# Patient Record
Sex: Female | Born: 1985 | Race: White | Hispanic: No | Marital: Single | Smoking: Former smoker
Health system: Southern US, Community
[De-identification: ages and names within clinical notes are randomized; demographics above are authoritative.]

## PROBLEM LIST (undated history)

## (undated) ENCOUNTER — Inpatient Hospital Stay (HOSPITAL_COMMUNITY): Payer: Self-pay

## (undated) DIAGNOSIS — Z3490 Encounter for supervision of normal pregnancy, unspecified, unspecified trimester: Secondary | ICD-10-CM

---

## 2008-11-20 ENCOUNTER — Emergency Department (HOSPITAL_BASED_OUTPATIENT_CLINIC_OR_DEPARTMENT_OTHER): Admission: EM | Admit: 2008-11-20 | Discharge: 2008-11-20 | Payer: Self-pay | Admitting: Emergency Medicine

## 2008-11-20 ENCOUNTER — Ambulatory Visit: Payer: Self-pay | Admitting: Interventional Radiology

## 2010-08-12 IMAGING — CT CT HEAD W/O CM
1 series · 16 of 30 positions shown, 20 images · non-contrast
Comparison: None

CLINICAL DATA: Altered level of consciousness, headache

CT HEAD WITHOUT CONTRAST
TECHNIQUE: Contiguous axial images were obtained from the base of
the skull through the vertex without contrast.

[Series 2: head 4.8 h37s · axial · 0.44mm/px · z∈[-145,-9]mm · 16 of 32 slices shown, 20 images]
[im 2/32  brain]
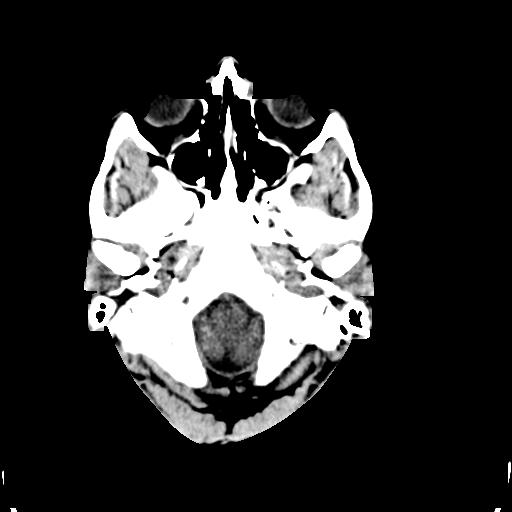
[im 2/32  bone]
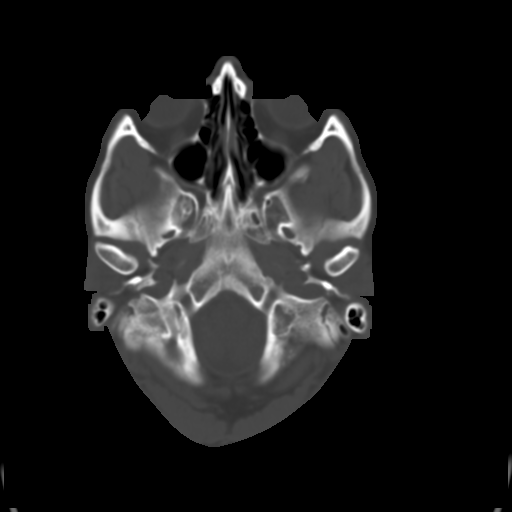
[im 4/32  brain]
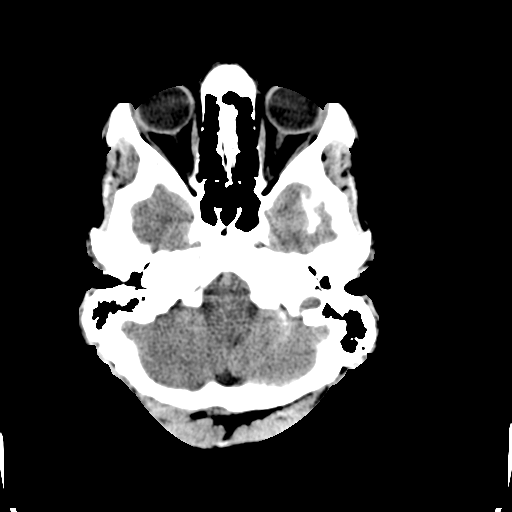
[im 6/32  brain]
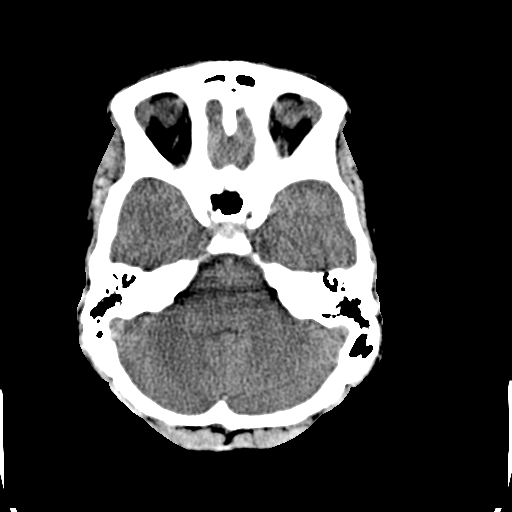
[im 8/32  brain]
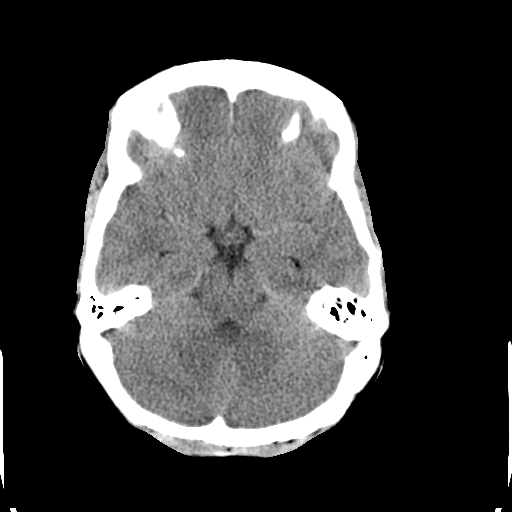
[im 9/32  brain]
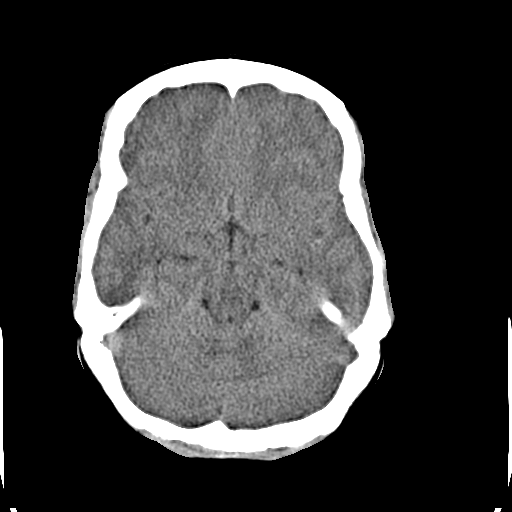
[im 9/32  bone]
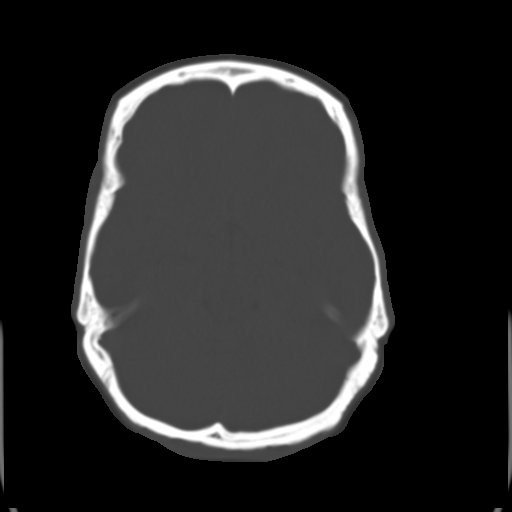
[im 11/32  brain]
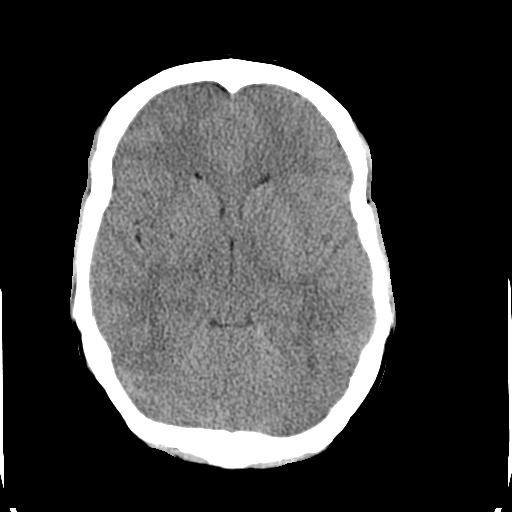
[im 13/32  brain]
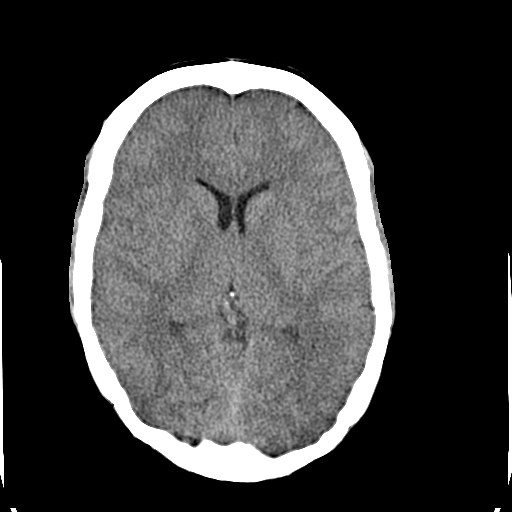
[im 15/32  brain]
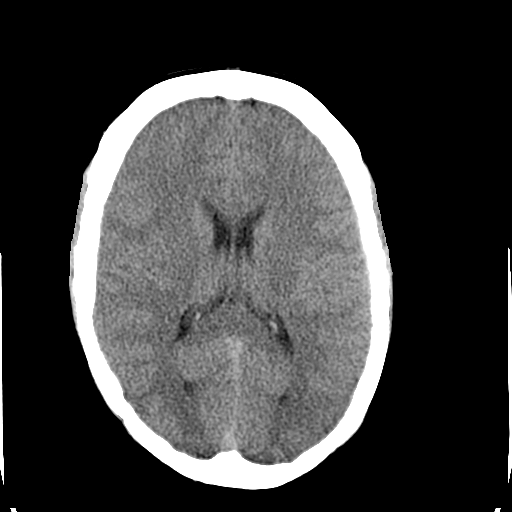
[im 17/32  brain]
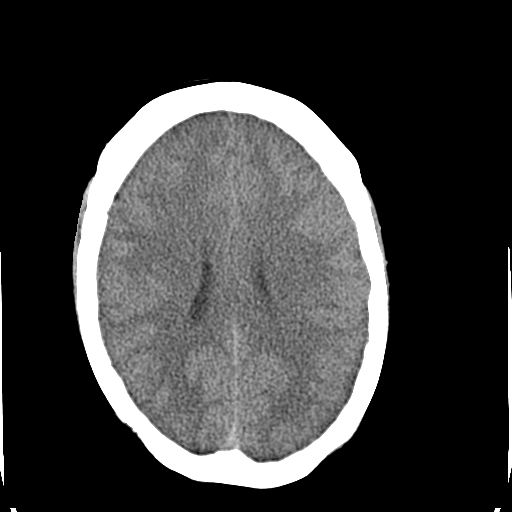
[im 17/32  bone]
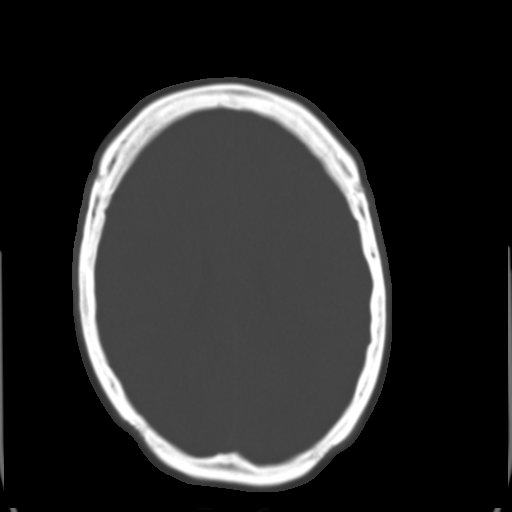
[im 19/32  brain]
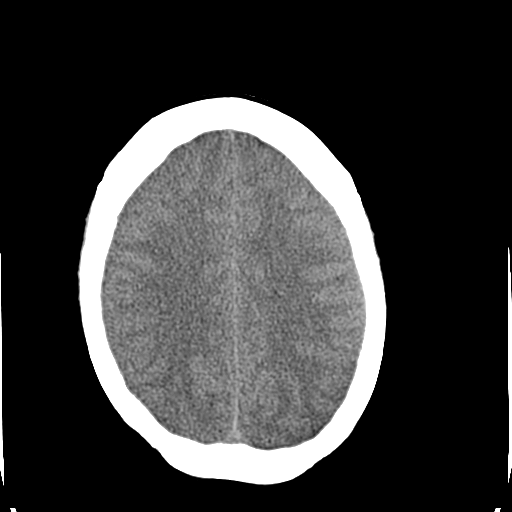
[im 21/32  brain]
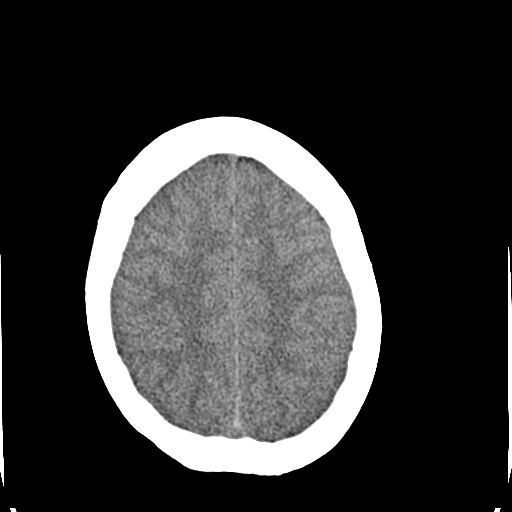
[im 23/32  brain]
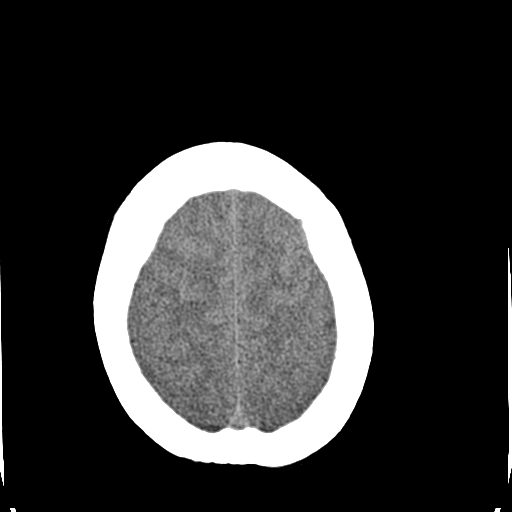
[im 24/32  brain]
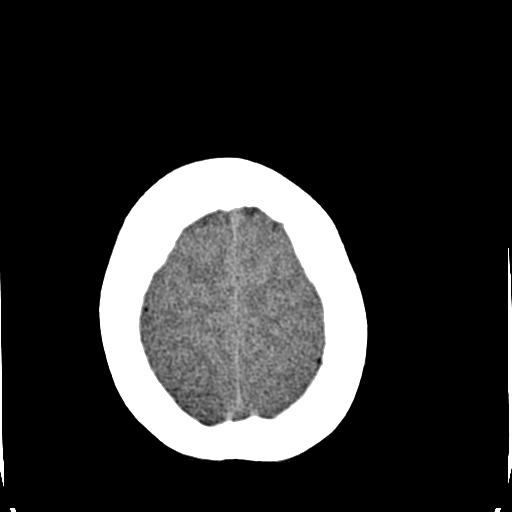
[im 24/32  bone]
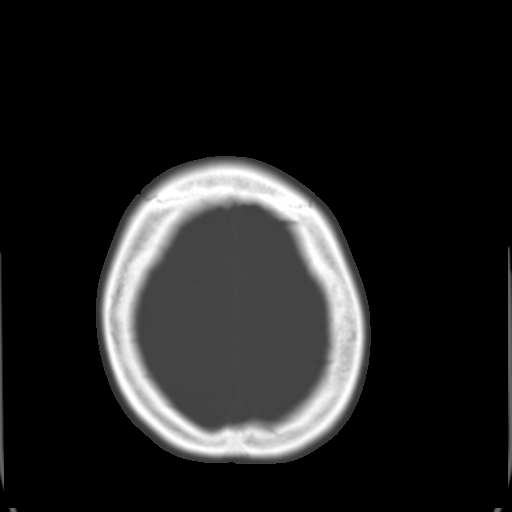
[im 26/32  brain]
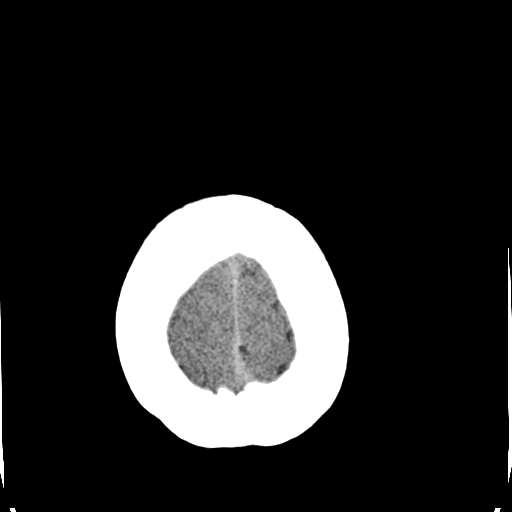
[im 28/32  brain]
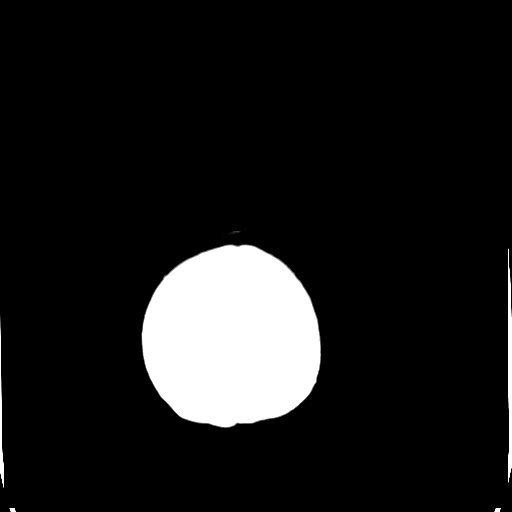
[im 30/32  brain]
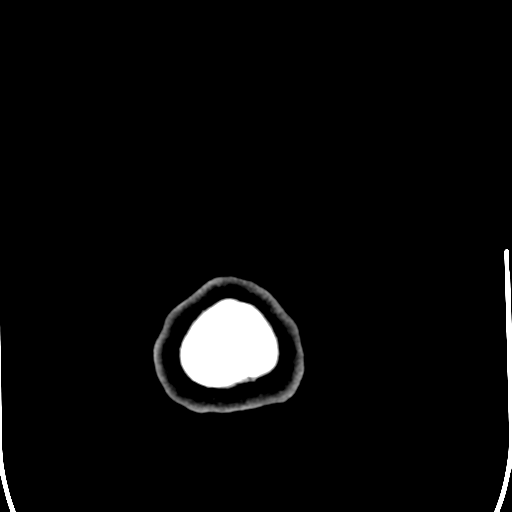

[16 of 30 positions shown; findings below may reference images not displayed]

FINDINGS: There is no evidence of acute intracranial hemorrhage,
brain edema, mass lesion, acute infarction,   mass effect, or
midline shift. Acute infarct may be inapparent on noncontrast CT.
No other intra-axial abnormalities are seen, and the ventricles and
sulci are within normal limits in size and symmetry.   No abnormal
extra-axial fluid collections or masses are identified.  No
significant calvarial abnormality.
IMPRESSION: Negative for bleed or other acute intracranial process.

## 2010-11-25 LAB — COMPREHENSIVE METABOLIC PANEL
ALT: 12 U/L (ref 0–35)
AST: 21 U/L (ref 0–37)
Albumin: 4.6 g/dL (ref 3.5–5.2)
Alkaline Phosphatase: 87 U/L (ref 39–117)
BUN: 13 mg/dL (ref 6–23)
Chloride: 104 mEq/L (ref 96–112)
Potassium: 4 mEq/L (ref 3.5–5.1)
Sodium: 143 mEq/L (ref 135–145)
Total Bilirubin: 0.5 mg/dL (ref 0.3–1.2)
Total Protein: 8.5 g/dL — ABNORMAL HIGH (ref 6.0–8.3)

## 2010-11-25 LAB — POCT TOXICOLOGY PANEL

## 2010-11-25 LAB — DIFFERENTIAL
Basophils Absolute: 0 10*3/uL (ref 0.0–0.1)
Basophils Relative: 0 % (ref 0–1)
Eosinophils Absolute: 0.1 10*3/uL (ref 0.0–0.7)
Eosinophils Relative: 1 % (ref 0–5)
Monocytes Absolute: 0.6 10*3/uL (ref 0.1–1.0)
Monocytes Relative: 6 % (ref 3–12)
Neutro Abs: 8.9 10*3/uL — ABNORMAL HIGH (ref 1.7–7.7)

## 2010-11-25 LAB — URINALYSIS, ROUTINE W REFLEX MICROSCOPIC
Bilirubin Urine: NEGATIVE
Glucose, UA: NEGATIVE mg/dL
Protein, ur: 100 mg/dL — AB
Urobilinogen, UA: 1 mg/dL (ref 0.0–1.0)

## 2010-11-25 LAB — CBC
HCT: 39.1 % (ref 36.0–46.0)
Platelets: 234 10*3/uL (ref 150–400)
RDW: 14.1 % (ref 11.5–15.5)
WBC: 10.4 10*3/uL (ref 4.0–10.5)

## 2010-11-25 LAB — POCT CARDIAC MARKERS
Myoglobin, poc: 117 ng/mL (ref 12–200)
Troponin i, poc: <0.05 ng/mL (ref 0.00–0.09)

## 2010-11-25 LAB — ETHANOL: Alcohol, Ethyl (B): <10 mg/dL (ref 0–10)

## 2010-11-25 LAB — URINE MICROSCOPIC-ADD ON

## 2011-11-08 ENCOUNTER — Emergency Department (HOSPITAL_BASED_OUTPATIENT_CLINIC_OR_DEPARTMENT_OTHER)
Admission: EM | Admit: 2011-11-08 | Discharge: 2011-11-08 | Disposition: A | Discharge status: Home / Self Care | Payer: Self-pay | Attending: Emergency Medicine | Admitting: Emergency Medicine

## 2011-11-08 ENCOUNTER — Encounter (HOSPITAL_BASED_OUTPATIENT_CLINIC_OR_DEPARTMENT_OTHER): Payer: Self-pay | Admitting: Family Medicine

## 2011-11-08 DIAGNOSIS — J020 Streptococcal pharyngitis: Secondary | ICD-10-CM | POA: Insufficient documentation

## 2011-11-08 DIAGNOSIS — R51 Headache: Secondary | ICD-10-CM | POA: Insufficient documentation

## 2011-11-08 LAB — RAPID STREP SCREEN (MED CTR MEBANE ONLY): Streptococcus, Group A Screen (Direct): POSITIVE — AB

## 2011-11-08 MED ORDER — DEXAMETHASONE SODIUM PHOSPHATE 10 MG/ML IJ SOLN
10.0000 mg | Freq: Once | INTRAMUSCULAR | Status: AC
  Administered 2011-11-08: 10 mg via INTRAMUSCULAR
  Filled 2011-11-08: qty 1

## 2011-11-08 MED ORDER — PENICILLIN G BENZATHINE 1200000 UNIT/2ML IM SUSP
1.2000 10*6.[IU] | Freq: Once | INTRAMUSCULAR | Status: AC
  Administered 2011-11-08: 1.2 10*6.[IU] via INTRAMUSCULAR
  Filled 2011-11-08: qty 2

## 2011-11-08 MED ORDER — KETOROLAC TROMETHAMINE 60 MG/2ML IM SOLN
60.0000 mg | Freq: Once | INTRAMUSCULAR | Status: AC
  Administered 2011-11-08: 60 mg via INTRAMUSCULAR
  Filled 2011-11-08: qty 2

## 2011-11-08 MED ORDER — BUTALBITAL-APAP-CAFFEINE 50-325-40 MG PO TABS: 1.0000 | ORAL_TABLET | Freq: Four times a day (QID) | ORAL | Status: DC | PRN

## 2011-11-08 NOTE — ED Notes (Signed)
Patient reports headaches that have been occuring for the past 5-6 weeks.

## 2011-11-08 NOTE — ED Provider Notes (Signed)
History     CSN: [id number]  Arrival date & time 11/08/11  0865   First MD Initiated Contact with Patient 11/08/11 1027      Chief Complaint  Patient presents with  . Headache    (Consider location/radiation/quality/duration/timing/severity/associated sxs/prior treatment) HPI Comments: Patient presents with 2 complaints.  She complains of a headache that's been going on for approximately 5-6 weeks and has been constant in nature.  She describes it as a generalized dull and throbbing headache.  She has tried aspirin and ibuprofen and Tylenol for it without significant relief.  She has no associated fevers, nausea, vomiting or neck stiffness.  She denies prior history of headaches or migraines.  She has not had her primary care physician so she has not followed up with anyone for this.  She secondarily presents for sore throat which is been going on for 2 days and pain with swallowing.  No shortness of breath.  No other cough or cold symptoms.  Patient is a 26 y.o. female presenting with headaches. The history is provided by the patient. No language interpreter was used.  Headache  This is a chronic problem. The current episode started more than 1 week ago. The problem occurs constantly. The problem has not changed since onset.The headache is associated with nothing. The quality of the pain is described as dull and throbbing. The pain is moderate. The pain does not radiate. Pertinent negatives include no anorexia, no fever, no malaise/fatigue, no chest pressure, no near-syncope, no orthopnea, no palpitations, no syncope, no shortness of breath, no nausea and no vomiting.    History reviewed. No pertinent past medical history.  Past Surgical History  Procedure Date  . Cesarean section     History reviewed. No pertinent family history.  History  Substance Use Topics  . Smoking status: Current Some Day Smoker  . Smokeless tobacco: Not on file  . Alcohol Use: Yes    OB History    Grav Para Term Preterm Abortions TAB SAB Ect Mult Living                  Review of Systems  Constitutional: Negative.  Negative for fever, chills and malaise/fatigue.  HENT: Positive for sore throat.   Eyes: Negative.  Negative for discharge and redness.  Respiratory: Negative.  Negative for cough and shortness of breath.   Cardiovascular: Negative.  Negative for chest pain, palpitations, orthopnea, syncope and near-syncope.  Gastrointestinal: Negative.  Negative for nausea, vomiting, abdominal pain, diarrhea and anorexia.  Genitourinary: Negative.  Negative for dysuria and vaginal discharge.  Musculoskeletal: Negative.  Negative for back pain.  Skin: Negative.  Negative for color change and rash.  Neurological: Positive for headaches. Negative for syncope.  Hematological: Negative.  Negative for adenopathy.  Psychiatric/Behavioral: Negative.  Negative for confusion.  All other systems reviewed and are negative.    Allergies  Review of patient's allergies indicates no known allergies.  Home Medications   Current Outpatient Rx  Name Route Sig Dispense Refill  . ASPIRIN BUF(ALHYD-MGHYD-CACAR) PO Oral Take by mouth.    Marland Kitchen ADVIL PO Oral Take by mouth.      BP 121/86  Pulse 110  Temp(Src) 97.8 F (36.6 C) (Oral)  Resp 20  Ht 5\' 7"  (1.702 m)  Wt 140 lb (63.504 kg)  BMI 21.93 kg/m2  SpO2 100%  LMP 10/11/2011  Physical Exam  Nursing note and vitals reviewed. Constitutional: She is oriented to person, place, and time. She appears well-developed and well-nourished.  Non-toxic appearance. She does not have a sickly appearance.  HENT:  Head: Normocephalic and atraumatic.  Mouth/Throat: No oropharyngeal exudate.       Moderate erythema to the posterior oropharynx without any tonsillar swelling or exudates.  Uvula is midline.  Eyes: Conjunctivae, EOM and lids are normal. Pupils are equal, round, and reactive to light. No scleral icterus.  Neck: Trachea normal and normal range of  motion. Neck supple. No thyromegaly present.  Cardiovascular: Normal rate, regular rhythm and normal heart sounds.   Pulmonary/Chest: Effort normal and breath sounds normal. No respiratory distress. She has no wheezes. She has no rales.  Abdominal: Soft. Normal appearance. There is no tenderness. There is no rebound, no guarding and no CVA tenderness.  Musculoskeletal: Normal range of motion.  Lymphadenopathy:    She has no cervical adenopathy.  Neurological: She is alert and oriented to person, place, and time. She has normal strength.  Skin: Skin is warm, dry and intact. No rash noted.  Psychiatric: She has a normal mood and affect. Her behavior is normal. Judgment and thought content normal.    ED Course  Procedures (including critical care time)  Labs Reviewed  RAPID STREP SCREEN - Abnormal; Notable for the following:    Streptococcus, Group A Screen (Direct) POSITIVE (*)    All other components within normal limits   No results found.   No diagnosis found.    MDM  Patient's her throat is likely due to strep pharyngitis.  We'll treat her with a Bicillin injection here.  Patient's headaches may potentially be migraine but do not appear to be an acute cause.  She has no neurologic deficits.  No fevers to suggest meningitis.  This was not a sudden onset headache to suggest subarachnoid hemorrhage.  I've recommended that the patient followup with a primary care physician or a neurologist.  I will give her information for the neurologists locally as well.        Nat Christen, MD 11/08/11 1220

## 2011-11-08 NOTE — Discharge Instructions (Signed)
Headache, General, Unknown Cause The specific cause of your headache may not have been found today. There are many causes and types of headache. A few common ones are:  Tension headache.   Migraine.   Infections (examples: dental and sinus infections).   Bone and/or joint problems in the neck or jaw.   Depression.   Eye problems.  These headaches are not life threatening.  Headaches can sometimes be diagnosed by a patient history and a physical exam. Sometimes, lab and imaging studies (such as x-ray and/or CT scan) are used to rule out more serious problems. In some cases, a spinal tap (lumbar puncture) may be requested. There are many times when your exam and tests may be normal on the first visit even when there is a serious problem causing your headaches. Because of that, it is very important to follow up with your doctor or local clinic for further evaluation. FINDING OUT THE RESULTS OF TESTS  If a radiology test was performed, a radiologist will review your results.   You will be contacted by the emergency department or your physician if any test results require a change in your treatment plan.   Not all test results may be available during your visit. If your test results are not back during the visit, make an appointment with your caregiver to find out the results. Do not assume everything is normal if you have not heard from your caregiver or the medical facility. It is important for you to follow up on all of your test results.  HOME CARE INSTRUCTIONS   Keep follow-up appointments with your caregiver, or any specialist referral.   Only take over-the-counter or prescription medicines for pain, discomfort, or fever as directed by your caregiver.   Biofeedback, massage, or other relaxation techniques may be helpful.   Ice packs or heat applied to the head and neck can be used. Do this three to four times per day, or as needed.   Call your doctor if you have any questions or  concerns.   If you smoke, you should quit.  SEEK MEDICAL CARE IF:   You develop problems with medications prescribed.   You do not respond to or obtain relief from medications.   You have a change from the usual headache.   You develop nausea or vomiting.  SEEK IMMEDIATE MEDICAL CARE IF:   If your headache becomes severe.   You have an unexplained oral temperature above 102 F (38.9 C), or as your caregiver suggests.   You have a stiff neck.   You have loss of vision.   You have muscular weakness.   You have loss of muscular control.   You develop severe symptoms different from your first symptoms.   You start losing your balance or have trouble walking.   You feel faint or pass out.  MAKE SURE YOU:   Understand these instructions.   Will watch your condition.   Will get help right away if you are not doing well or get worse.  Document Released: 08/02/2005 Document Revised: 07/22/2011 Document Reviewed: 03/21/2008 [facility] Patient Information 2012 [place], [place].  Strep Throat Strep throat is an infection of the throat caused by a bacteria named Streptococcus pyogenes. Your caregiver may call the infection streptococcal "tonsillitis" or "pharyngitis" depending on whether there are signs of inflammation in the tonsils or back of the throat. Strep throat is most common in children from 56 to 26 years old during the cold months of the year, but  it can occur in people of any age during any season. This infection is spread from person to person (contagious) through coughing, sneezing, or other close contact. SYMPTOMS   Fever or chills.   Painful, swollen, red tonsils or throat.   Pain or difficulty when swallowing.   White or yellow spots on the tonsils or throat.   Swollen, tender lymph nodes or "glands" of the neck or under the jaw.   Red rash all over the body (rare).  DIAGNOSIS  Many different infections can cause the same symptoms. A test must be done to  confirm the diagnosis so the right treatment can be given. A "rapid strep test" can help your caregiver make the diagnosis in a few minutes. If this test is not available, a light swab of the infected area can be used for a throat culture test. If a throat culture test is done, results are usually available in a day or two. TREATMENT  Strep throat is treated with antibiotic medicine. HOME CARE INSTRUCTIONS   Gargle with 1 tsp of salt in 1 cup of warm water, 3 to 4 times per day or as needed for comfort.   Family members who also have a sore throat or fever should be tested for strep throat and treated with antibiotics if they have the strep infection.   Make sure everyone in your household washes their hands well.   Do not share food, drinking cups, or personal items that could cause the infection to spread to others.   You may need to eat a soft food diet until your sore throat gets better.   Drink enough water and fluids to keep your urine clear or pale yellow. This will help prevent dehydration.   Get plenty of rest.   Stay home from school, daycare, or work until you have been on antibiotics for 24 hours.   Only take over-the-counter or prescription medicines for pain, discomfort, or fever as directed by your caregiver.   If antibiotics are prescribed, take them as directed. Finish them even if you start to feel better.  SEEK MEDICAL CARE IF:   The glands in your neck continue to enlarge.   You develop a rash, cough, or earache.   You cough up green, yellow-brown, or bloody sputum.   You have pain or discomfort not controlled by medicines.   Your problems seem to be getting worse rather than better.  SEEK IMMEDIATE MEDICAL CARE IF:   You develop any new symptoms such as vomiting, severe headache, stiff or painful neck, chest pain, shortness of breath, or trouble swallowing.   You develop severe throat pain, drooling, or changes in your voice.   You develop swelling of the  neck, or the skin on the neck becomes red and tender.   You have a fever.   You develop signs of dehydration, such as fatigue, dry mouth, and decreased urination.   You become increasingly sleepy, or you cannot wake up completely.  Document Released: 07/30/2000 Document Revised: 07/22/2011 Document Reviewed: 10/01/2010 [facility] Patient Information 2012 [place], [place].

## 2011-11-08 NOTE — ED Notes (Signed)
Pt c/o headache 5-6 weeks with "some numbness to my right face" and ringing in ears. Pt also reports nausea intermittently.

## 2012-06-20 ENCOUNTER — Emergency Department (HOSPITAL_BASED_OUTPATIENT_CLINIC_OR_DEPARTMENT_OTHER)
Admission: EM | Admit: 2012-06-20 | Discharge: 2012-06-20 | Disposition: A | Discharge status: Home / Self Care | Payer: Self-pay | Attending: Emergency Medicine | Admitting: Emergency Medicine

## 2012-06-20 ENCOUNTER — Encounter (HOSPITAL_BASED_OUTPATIENT_CLINIC_OR_DEPARTMENT_OTHER): Payer: Self-pay | Admitting: *Deleted

## 2012-06-20 DIAGNOSIS — L299 Pruritus, unspecified: Secondary | ICD-10-CM | POA: Insufficient documentation

## 2012-06-20 DIAGNOSIS — L509 Urticaria, unspecified: Secondary | ICD-10-CM | POA: Insufficient documentation

## 2012-06-20 DIAGNOSIS — F17200 Nicotine dependence, unspecified, uncomplicated: Secondary | ICD-10-CM | POA: Insufficient documentation

## 2012-06-20 MED ORDER — CETIRIZINE HCL 10 MG PO CHEW: 10.0000 mg | CHEWABLE_TABLET | Freq: Every day | ORAL | Status: DC

## 2012-06-20 MED ORDER — DEXAMETHASONE SODIUM PHOSPHATE 4 MG/ML IJ SOLN
10.0000 mg | Freq: Once | INTRAMUSCULAR | Status: AC
  Administered 2012-06-20: 10 mg via INTRAMUSCULAR
  Filled 2012-06-20: qty 1
  Filled 2012-06-20: qty 3

## 2012-06-20 MED ORDER — FAMOTIDINE 20 MG PO TABS: 20.0000 mg | ORAL_TABLET | Freq: Two times a day (BID) | ORAL | Status: DC

## 2012-06-20 NOTE — ED Provider Notes (Signed)
History     CSN: [id number]  Arrival date & time 06/20/12  4782   First MD Initiated Contact with Patient 06/20/12 [phone]      Chief Complaint  Patient presents with  . Rash    HPI 2 week history of rash.  Very pruritic.  Given cream.  No better, maybe worse.  No fever primarily trunk and arms.  Some on top of legs.    History reviewed. No pertinent past medical history.  Past Surgical History  Procedure Date  . Cesarean section     History reviewed. No pertinent family history.  History  Substance Use Topics  . Smoking status: Current Some Day Smoker  . Smokeless tobacco: Not on file  . Alcohol Use: Yes    OB History    Grav Para Term Preterm Abortions TAB SAB Ect Mult Living                  Review of Systems All other systems reviewed and are negative Allergies  Review of patient's allergies indicates no known allergies.  Home Medications   Current Outpatient Rx  Name  Route  Sig  Dispense  Refill  . ASPIRIN BUF(ALHYD-MGHYD-CACAR) PO   Oral   Take by mouth.         Marland Kitchen BUTALBITAL-APAP-CAFFEINE 50-325-40 MG PO TABS   Oral   Take 1-2 tablets by mouth every 6 (six) hours as needed for headache.   20 tablet   0   . CETIRIZINE HCL 10 MG PO CHEW   Oral   Chew 1 tablet (10 mg total) by mouth daily.   30 tablet   0   . FAMOTIDINE 20 MG PO TABS   Oral   Take 1 tablet (20 mg total) by mouth 2 (two) times daily.   30 tablet   0   . ADVIL PO   Oral   Take by mouth.           BP 114/66  Pulse 82  Temp 98.7 F (37.1 C) (Oral)  Resp 20  Ht 5\' 7"  (1.702 m)  Wt 140 lb (63.504 kg)  BMI 21.93 kg/m2  SpO2 100%  LMP 06/06/2012  Physical Exam  Nursing note and vitals reviewed. Constitutional: She is oriented to person, place, and time. She appears well-developed and well-nourished. No distress.  HENT:  Head: Normocephalic and atraumatic.  Eyes: Pupils are equal, round, and reactive to light.  Neck: Normal range of motion.  Cardiovascular:  Normal rate and intact distal pulses.   Pulmonary/Chest: No respiratory distress.  Abdominal: Normal appearance. She exhibits no distension.  Musculoskeletal: Normal range of motion.  Neurological: She is alert and oriented to person, place, and time. No cranial nerve deficit.  Skin: Skin is warm and dry. Rash (Urticarial rash noted primarily on trunk and arms.  Some lesions on upper legs.) noted.  Psychiatric: She has a normal mood and affect. Her behavior is normal.    ED Course  Procedures (including critical care time)  Labs Reviewed - No data to display No results found.   1. Urticaria       MDM         Nelia Shi, MD 06/20/12 1007

## 2012-06-20 NOTE — ED Notes (Signed)
Pt has been breaking out in hives off and on for 2 weeks no new products reported

## 2012-06-29 ENCOUNTER — Emergency Department (HOSPITAL_BASED_OUTPATIENT_CLINIC_OR_DEPARTMENT_OTHER)
Admission: EM | Admit: 2012-06-29 | Discharge: 2012-06-29 | Disposition: A | Discharge status: Home / Self Care | Payer: Self-pay | Attending: Emergency Medicine | Admitting: Emergency Medicine

## 2012-06-29 ENCOUNTER — Encounter (HOSPITAL_BASED_OUTPATIENT_CLINIC_OR_DEPARTMENT_OTHER): Payer: Self-pay | Admitting: *Deleted

## 2012-06-29 DIAGNOSIS — F17200 Nicotine dependence, unspecified, uncomplicated: Secondary | ICD-10-CM | POA: Insufficient documentation

## 2012-06-29 DIAGNOSIS — R21 Rash and other nonspecific skin eruption: Secondary | ICD-10-CM | POA: Insufficient documentation

## 2012-06-29 DIAGNOSIS — Z7982 Long term (current) use of aspirin: Secondary | ICD-10-CM | POA: Insufficient documentation

## 2012-06-29 MED ORDER — PREDNISONE 20 MG PO TABS: 60.0000 mg | ORAL_TABLET | Freq: Every day | ORAL | Status: DC

## 2012-06-29 MED ORDER — PREDNISONE 50 MG PO TABS
60.0000 mg | ORAL_TABLET | Freq: Once | ORAL | Status: AC
  Administered 2012-06-29: 60 mg via ORAL
  Filled 2012-06-29: qty 1

## 2012-06-29 NOTE — Discharge Instructions (Signed)
Rash A rash is a change in the color or texture of your skin. There are many different types of rashes. You may have other problems that accompany your rash. CAUSES   Infections.  Allergic reactions. This can include allergies to pets or foods.  Certain medicines.  Exposure to certain chemicals, soaps, or cosmetics.  Heat.  Exposure to poisonous plants.  Tumors, both cancerous and noncancerous. SYMPTOMS   Redness.  Scaly skin.  Itchy skin.  Dry or cracked skin.  Bumps.  Blisters.  Pain. DIAGNOSIS  Your caregiver may do a physical exam to determine what type of rash you have. A skin sample (biopsy) may be taken and examined under a microscope. TREATMENT  Treatment depends on the type of rash you have. Your caregiver may prescribe certain medicines. For serious conditions, you may need to see a skin doctor (dermatologist). HOME CARE INSTRUCTIONS   Avoid the substance that caused your rash.  Do not scratch your rash. This can cause infection.  You may take cool baths to help stop itching.  Only take over-the-counter or prescription medicines as directed by your caregiver.  Keep all follow-up appointments as directed by your caregiver. SEEK IMMEDIATE MEDICAL CARE IF:  You have increasing pain, swelling, or redness.  You have a fever.  You have new or severe symptoms.  You have body aches, diarrhea, or vomiting.  Your rash is not better after 3 days. MAKE SURE YOU:  Understand these instructions.  Will watch your condition.  Will get help right away if you are not doing well or get worse. Document Released: 07/23/2002 Document Revised: 10/25/2011 Document Reviewed: 05/17/2011 [facility] Patient Information 2013 [place], [place].   Pityriasis Rosea Pityriasis rosea is a rash which is probably caused by a virus. It generally starts as a scaly, red patch on the trunk (the area of the body that a t-shirt would cover) but does not appear on sun exposed areas.  The rash is usually preceded by an initial larger spot called the "herald patch" a week or more before the rest of the rash appears. Generally within one to two days the rash appears rapidly on the trunk, upper arms, and sometimes the upper legs. The rash usually appears as flat, oval patches of scaly pink color. The rash can also be raised and one is able to feel it with a finger. The rash can also be finely crinkled and may slough off leaving a ring of scale around the spot. Sometimes a mild sore throat is present with the rash. It usually affects children and young adults in the spring and autumn. Women are more frequently affected than men. TREATMENT  Pityriasis rosea is a self-limited condition. This means it goes away within 4 to 8 weeks without treatment. The spots may persist for several months, especially in darker-colored skin after the rash has resolved and healed. Benadryl and steroid creams may be used if itching is a problem. SEEK MEDICAL CARE IF:   Your rash does not go away or persists longer than three months.  You develop fever and joint pain.  You develop severe headache and confusion.  You develop breathing difficulty, vomiting and/or extreme weakness. Document Released: 09/08/2001 Document Revised: 10/25/2011 Document Reviewed: 09/27/2008 [facility] Patient Information 2013 [place], [place].  Prednisone tablets What is this medicine? PREDNISONE (PRED ni sone) is a corticosteroid. It is commonly used to treat inflammation of the skin, joints, lungs, and other organs. Common conditions treated include asthma, allergies, and arthritis. It is also used  for other conditions, such as blood disorders and diseases of the adrenal glands. This medicine may be used for other purposes; ask your health care provider or pharmacist if you have questions. What should I tell my health care provider before I take this medicine? They need to know if you have any of these conditions: -Cushing's  syndrome -diabetes -glaucoma -heart disease -high blood pressure -infection (especially a virus infection such as chickenpox, cold sores, or herpes) -kidney disease -liver disease -mental illness -myasthenia gravis -osteoporosis -seizures -stomach or intestine problems -thyroid disease -an unusual or allergic reaction to lactose, prednisone, other medicines, foods, dyes, or preservatives -pregnant or trying to get pregnant -breast-feeding How should I use this medicine? Take this medicine by mouth with a glass of water. Follow the directions on the prescription label. Take this medicine with food. If you are taking this medicine once a day, take it in the morning. Do not take more medicine than you are told to take. Do not suddenly stop taking your medicine because you may develop a severe reaction. Your doctor will tell you how much medicine to take. If your doctor wants you to stop the medicine, the dose may be slowly lowered over time to avoid any side effects. Talk to your pediatrician regarding the use of this medicine in children. Special care may be needed. Overdosage: If you think you have taken too much of this medicine contact a poison control center or emergency room at once. NOTE: This medicine is only for you. Do not share this medicine with others. What if I miss a dose? If you miss a dose, take it as soon as you can. If it is almost time for your next dose, talk to your doctor or health care professional. You may need to miss a dose or take an extra dose. Do not take double or extra doses without advice. What may interact with this medicine? Do not take this medicine with any of the following medications: -metyrapone -mifepristone This medicine may also interact with the following medications: -aminoglutethimide -amphotericin B -aspirin and aspirin-like medicines -barbiturates -certain medicines for diabetes, like glipizide or  glyburide -cholestyramine -cholinesterase inhibitors -cyclosporine -digoxin -diuretics -ephedrine -female hormones, like estrogens and birth control pills -isoniazid -ketoconazole -NSAIDS, medicines for pain and inflammation, like ibuprofen or naproxen -phenytoin -rifampin -toxoids -vaccines -warfarin This list may not describe all possible interactions. Give your health care provider a list of all the medicines, herbs, non-prescription drugs, or dietary supplements you use. Also tell them if you smoke, drink alcohol, or use illegal drugs. Some items may interact with your medicine. What should I watch for while using this medicine? Visit your doctor or health care professional for regular checks on your progress. If you are taking this medicine over a prolonged period, carry an identification card with your name and address, the type and dose of your medicine, and your doctor's name and address. This medicine may increase your risk of getting an infection. Tell your doctor or health care professional if you are around anyone with measles or chickenpox, or if you develop sores or blisters that do not heal properly. If you are going to have surgery, tell your doctor or health care professional that you have taken this medicine within the last twelve months. Ask your doctor or health care professional about your diet. You may need to lower the amount of salt you eat. This medicine may affect blood sugar levels. If you have diabetes, check with your doctor or  health care professional before you change your diet or the dose of your diabetic medicine. What side effects may I notice from receiving this medicine? Side effects that you should report to your doctor or health care professional as soon as possible: -allergic reactions like skin rash, itching or hives, swelling of the face, lips, or tongue -changes in emotions or moods -changes in vision -depressed mood -eye pain -fever or chills,  cough, sore throat, pain or difficulty passing urine -increased thirst -swelling of ankles, feet Side effects that usually do not require medical attention (report to your doctor or health care professional if they continue or are bothersome): -confusion, excitement, restlessness -headache -nausea, vomiting -skin problems, acne, thin and shiny skin -trouble sleeping -weight gain This list may not describe all possible side effects. Call your doctor for medical advice about side effects. You may report side effects to FDA at 1-800-FDA-1088. Where should I keep my medicine? Keep out of the reach of children. Store at room temperature between 15 and 30 degrees C (59 and 86 degrees F). Protect from light. Keep container tightly closed. Throw away any unused medicine after the expiration date. NOTE: This sheet is a summary. It may not cover all possible information. If you have questions about this medicine, talk to your doctor, pharmacist, or health care provider.  2012, [employer]. (03/18/2011 10:57:14 AM)

## 2012-06-29 NOTE — ED Provider Notes (Signed)
History     CSN: [id number]  Arrival date & time 06/29/12  6213   First MD Initiated Contact with Patient 06/29/12 [phone]      Chief Complaint  Patient presents with  . Urticaria    (Consider location/radiation/quality/duration/timing/severity/associated sxs/prior treatment) Patient is a 26 y.o. female presenting with urticaria. The history is provided by the patient.  Urticaria  She has had a rash which is primarily over her trunk for the last 3-4 weeks. The rash is erythematous and itchy. She was seen at an urgent care Center where she was diagnosed with a fungal infection given a cream which did not seem to help and the rash seemed to spread. She was then seen in the emergency Department given a steroid shot which seemed to give some slight improvement, but a cow worse over the next several days and then has persisted. She has a few lesions on her upper arms, but none on her face or lower arms or legs. Rashes very itchy. She's been taking Zyrtec and Pepcid with no relief. She denies difficulty breathing or swallowing.  History reviewed. No pertinent past medical history.  Past Surgical History  Procedure Date  . Cesarean section     No family history on file.  History  Substance Use Topics  . Smoking status: Current Some Day Smoker  . Smokeless tobacco: Not on file  . Alcohol Use: Yes    OB History    Grav Para Term Preterm Abortions TAB SAB Ect Mult Living                  Review of Systems  All other systems reviewed and are negative.    Allergies  Review of patient's allergies indicates no known allergies.  Home Medications   Current Outpatient Rx  Name  Route  Sig  Dispense  Refill  . ASPIRIN BUF(ALHYD-MGHYD-CACAR) PO   Oral   Take by mouth.         Marland Kitchen BUTALBITAL-APAP-CAFFEINE 50-325-40 MG PO TABS   Oral   Take 1-2 tablets by mouth every 6 (six) hours as needed for headache.   20 tablet   0   . CETIRIZINE HCL 10 MG PO CHEW   Oral   Chew 1  tablet (10 mg total) by mouth daily.   30 tablet   0   . FAMOTIDINE 20 MG PO TABS   Oral   Take 1 tablet (20 mg total) by mouth 2 (two) times daily.   30 tablet   0   . ADVIL PO   Oral   Take by mouth.         Marland Kitchen PREDNISONE 20 MG PO TABS   Oral   Take 3 tablets (60 mg total) by mouth daily.   30 tablet   0     BP 121/73  Pulse 79  Temp 98.3 F (36.8 C) (Oral)  Resp 16  Ht 5\' 7"  (1.702 m)  Wt 135 lb (61.236 kg)  BMI 21.14 kg/m2  SpO2 100%  LMP 06/06/2012  Physical Exam  Nursing note and vitals reviewed. 26 year old female, resting comfortably and in no acute distress. Vital signs are normal. Oxygen saturation is 100%, which is normal. Head is normocephalic and atraumatic. PERRLA, EOMI. Oropharynx is clear. Neck is nontender and supple without adenopathy or JVD. Back is nontender and there is no CVA tenderness. Lungs are clear without rales, wheezes, or rhonchi. Chest is nontender. Heart has regular rate and rhythm without murmur.  Abdomen is soft, flat, nontender without masses or hepatosplenomegaly and peristalsis is normoactive. Extremities have no cyanosis or edema, full range of motion is present. Skin is warm and dry. Rash is present primarily over the trunk but also a couple of lesions on the upper arms. It is erythematous and raised in patches which are from 3 mm to 1.2 cm. It does not appear urticarial. Neurologic: Mental status is normal, cranial nerves are intact, there are no motor or sensory deficits.   ED Course  Procedures (including critical care time)  Labs Reviewed - No data to display No results found.   1. Rash       MDM  Rash but does not appear to be classical urticarial. I am wondering whether this might be a variant of pityriasis rosea. She got partial relief with an injection of dexamethasone previously, so she will be tried on longer course of steroid and is given a prescription for prednisone. She is to followup with a  dermatologist if it is not improving at that point, or if the rash comes back. Old charts were reviewed and she was seen in the emergency department 9 days ago, diagnosed with urticaria, and given a single injection of dexamethasone.        Dione Booze, MD 06/29/12 [phone]

## 2012-06-29 NOTE — ED Notes (Signed)
Pt was seen here on 11/5 for same and got a shot which alleviated all symptoms for a couple of days.  Pt is now back with the same hive like rash on her left side, her arm, shoulder and breast.  This is itching, pt is unsure what caused it.  Unrelieved by zyrtec and pepcid.

## 2012-07-20 ENCOUNTER — Encounter (HOSPITAL_BASED_OUTPATIENT_CLINIC_OR_DEPARTMENT_OTHER): Payer: Self-pay | Admitting: *Deleted

## 2012-07-20 ENCOUNTER — Emergency Department (HOSPITAL_BASED_OUTPATIENT_CLINIC_OR_DEPARTMENT_OTHER)
Admission: EM | Admit: 2012-07-20 | Discharge: 2012-07-20 | Disposition: A | Discharge status: Home / Self Care | Payer: Self-pay | Attending: Emergency Medicine | Admitting: Emergency Medicine

## 2012-07-20 DIAGNOSIS — Z79899 Other long term (current) drug therapy: Secondary | ICD-10-CM | POA: Insufficient documentation

## 2012-07-20 DIAGNOSIS — F17200 Nicotine dependence, unspecified, uncomplicated: Secondary | ICD-10-CM | POA: Insufficient documentation

## 2012-07-20 DIAGNOSIS — R21 Rash and other nonspecific skin eruption: Secondary | ICD-10-CM | POA: Insufficient documentation

## 2012-07-20 DIAGNOSIS — R102 Pelvic and perineal pain: Secondary | ICD-10-CM

## 2012-07-20 DIAGNOSIS — A599 Trichomoniasis, unspecified: Secondary | ICD-10-CM

## 2012-07-20 DIAGNOSIS — N898 Other specified noninflammatory disorders of vagina: Secondary | ICD-10-CM | POA: Insufficient documentation

## 2012-07-20 DIAGNOSIS — A5901 Trichomonal vulvovaginitis: Secondary | ICD-10-CM | POA: Insufficient documentation

## 2012-07-20 DIAGNOSIS — Z7982 Long term (current) use of aspirin: Secondary | ICD-10-CM | POA: Insufficient documentation

## 2012-07-20 DIAGNOSIS — Z3202 Encounter for pregnancy test, result negative: Secondary | ICD-10-CM | POA: Insufficient documentation

## 2012-07-20 DIAGNOSIS — N644 Mastodynia: Secondary | ICD-10-CM

## 2012-07-20 DIAGNOSIS — N949 Unspecified condition associated with female genital organs and menstrual cycle: Secondary | ICD-10-CM | POA: Insufficient documentation

## 2012-07-20 LAB — WET PREP, GENITAL

## 2012-07-20 LAB — URINALYSIS, ROUTINE W REFLEX MICROSCOPIC
Bilirubin Urine: NEGATIVE
Hgb urine dipstick: NEGATIVE
Nitrite: NEGATIVE
Protein, ur: NEGATIVE mg/dL
Urobilinogen, UA: 0.2 mg/dL (ref 0.0–1.0)

## 2012-07-20 MED ORDER — CEFTRIAXONE SODIUM 250 MG IJ SOLR
250.0000 mg | Freq: Once | INTRAMUSCULAR | Status: AC
  Administered 2012-07-20: 250 mg via INTRAMUSCULAR
  Filled 2012-07-20: qty 250

## 2012-07-20 MED ORDER — FLUCONAZOLE 150 MG PO TABS: 150.0000 mg | ORAL_TABLET | ORAL | Status: AC

## 2012-07-20 MED ORDER — IBUPROFEN 800 MG PO TABS
800.0000 mg | ORAL_TABLET | Freq: Once | ORAL | Status: AC
  Administered 2012-07-20: 800 mg via ORAL
  Filled 2012-07-20: qty 1

## 2012-07-20 MED ORDER — IBUPROFEN 600 MG PO TABS: 600.0000 mg | ORAL_TABLET | Freq: Four times a day (QID) | ORAL | Status: DC | PRN

## 2012-07-20 MED ORDER — METRONIDAZOLE 500 MG PO TABS: 500.0000 mg | ORAL_TABLET | Freq: Two times a day (BID) | ORAL | Status: DC

## 2012-07-20 MED ORDER — AZITHROMYCIN 1 G PO PACK
1.0000 g | PACK | Freq: Once | ORAL | Status: AC
  Administered 2012-07-20: 1 g via ORAL
  Filled 2012-07-20: qty 1

## 2012-07-20 NOTE — ED Notes (Signed)
Pelvic cart is set up at the bedside and ready for the doctor to use. 

## 2012-07-20 NOTE — ED Notes (Signed)
Pt amb to room 9 with quick steady gait in nad. Pt reports several days of right breast soreness and tenderness, and clear vaginal discharge. Pt states she is also having pains to her lower abd that "shoot down through my vagina.Marland KitchenMarland Kitchen"

## 2012-07-20 NOTE — ED Provider Notes (Signed)
History     CSN: [id number]  Arrival date & time 07/20/12  1015   First MD Initiated Contact with Patient 07/20/12 1048      Chief Complaint  Patient presents with  . Breast Pain  . Vaginal Discharge    (Consider location/radiation/quality/duration/timing/severity/associated sxs/prior treatment) HPI Deanna Buckley is a 26 y.o. female last menstrual period about 10 days to 14 days ago presenting with pelvic pain, a thin watery vaginal discharge, also right breast pain. The right breast pain has been hurting for about 2 days there's been no nipple discharge, or nipple as "burning" the pain has been constant and has been 8/10. She's not taken anything for it. Patient says her vaginal pain is 10+ out of 10, it is shooting, is associated stomach cramping, she says it hurts her C-section line which is crampy about 8-9/10. She is sexually active with the same person for the last 2 or 3 years and does not use barrier contraception. She's had 3-4 days of this pain with no recent sex. She denies any fevers or chills, vomiting, diarrhea, nausea, dysuria, frequency, or bloody stools, chest pain or shortness of breath.   Patient's also complaining about a rash on her abdomen which has been treated in the past with steroids and topical creams- she says initially the cream that she used to treated cause spread but she is uncertain of what it was. She says it keeps on recurring. Says it is pruritic  History reviewed. No pertinent past medical history.  Past Surgical History  Procedure Date  . Cesarean section     History reviewed. No pertinent family history.  History  Substance Use Topics  . Smoking status: Current Every Day Smoker  . Smokeless tobacco: Not on file  . Alcohol Use: Yes    OB History    Grav Para Term Preterm Abortions TAB SAB Ect Mult Living                  Review of Systems At least 10pt or greater review of systems completed and are negative except where specified in  the HPI.  Allergies  Review of patient's allergies indicates no known allergies.  Home Medications   Current Outpatient Rx  Name  Route  Sig  Dispense  Refill  . ASPIRIN BUF(ALHYD-MGHYD-CACAR) PO   Oral   Take by mouth.         Marland Kitchen BUTALBITAL-APAP-CAFFEINE 50-325-40 MG PO TABS   Oral   Take 1-2 tablets by mouth every 6 (six) hours as needed for headache.   20 tablet   0   . CETIRIZINE HCL 10 MG PO CHEW   Oral   Chew 1 tablet (10 mg total) by mouth daily.   30 tablet   0   . FAMOTIDINE 20 MG PO TABS   Oral   Take 1 tablet (20 mg total) by mouth 2 (two) times daily.   30 tablet   0   . ADVIL PO   Oral   Take by mouth.         Marland Kitchen PREDNISONE 20 MG PO TABS   Oral   Take 3 tablets (60 mg total) by mouth daily.   30 tablet   0     BP 128/66  Pulse 89  Temp 98 F (36.7 C) (Oral)  Resp 18  Ht 5\' 7"  (1.702 m)  Wt 140 lb (63.504 kg)  BMI 21.93 kg/m2  SpO2 100%  LMP 07/13/2012  Physical Exam  Nursing  notes reviewed.  Electronic medical record reviewed. VITAL SIGNS:   Filed Vitals:   07/20/12 1027 07/20/12 1031  BP:  128/66  Pulse:  89  Temp:  98 F (36.7 C)  TempSrc:  Oral  Resp:  18  Height: 5\' 7"  (1.702 m)   Weight: 140 lb (63.504 kg)   SpO2:  100%   CONSTITUTIONAL: Awake, oriented, appears non-toxic HENT: Atraumatic, normocephalic, oral mucosa pink and moist, airway patent. Nares patent without drainage. External ears normal. EYES: Conjunctiva clear, EOMI, PERRLA NECK: Trachea midline, non-tender, supple CARDIOVASCULAR: Normal heart rate, Normal rhythm, No murmurs, rubs, gallops PULMONARY/CHEST: Clear to auscultation, no rhonchi, wheezes, or rales. Symmetrical breath sounds. Right breast appears normal, no dimpling, no nipple discharge. No lymphadenopathy. There is some fibrocystic change in the breast is mildly tender to palpation. There is no erythema, warmth or drainage. ABDOMINAL: Non-distended, soft, non-tender - no rebound or guarding.  BS  normal. NEUROLOGIC: Non-focal, moving all four extremities, no gross sensory or motor deficits. EXTREMITIES: No clubbing, cyanosis, or edema SKIN: Warm, Dry, No erythema; multiple well-circumscribed slightly raised erythematous lesions on the patient's abdomen starting to the left of midline and continuing around to the left flank. There is an area of central clearing, and some whitish scale over the erythematous region. PELVIC EXAM: normal external genitalia, vulva, vagina has some whitish discharge, cervix with scant amount of brownish fluid from os, uterus tender to manipulation and adnexa not well palpated.  ED Course  Procedures (including critical care time)  Labs Reviewed  URINALYSIS, ROUTINE W REFLEX MICROSCOPIC - Abnormal; Notable for the following:    APPearance CLOUDY (*)     All other components within normal limits  WET PREP, GENITAL - Abnormal; Notable for the following:    Yeast Wet Prep HPF POC FEW (*)     Trich, Wet Prep FEW (*)     Clue Cells Wet Prep HPF POC MANY (*)     WBC, Wet Prep HPF POC FEW (*)     All other components within normal limits  PREGNANCY, URINE  GC/CHLAMYDIA PROBE AMP   No results found.   1. Pelvic pain   2. Vaginal discharge   3. Breast pain   4. Trichomonas       MDM  Deanna Buckley is a 26 y.o. female presenting with pelvic pain and breast pain. I think the breast pain is likely benign and self-limited, may be fibrocystic change or related to the patient's smoking. There is no physical changes an inflammatory cancer - no family history of breast cancer either.  Pelvic exam is concerning for PID as patient does have cervical motion tenderness and a discharge.  Wet prep shows Trichomonas many white cells. I discussed treating the patient for PID and for Trichomonas, patient agrees to both. Patient also understands that she must followup with OB/GYN for both her breast pain and for further STI testing, patient is also not up to date with her  Pap. No evidence for urinary tract infection.  Patient also came in complaining about a rash on her abdomen, she's been treated for allergic symptoms, however on this visit it appears different, there are multiple circular lesions with erythema and central clearing with a very fine scale suggestive of tinea corporis - will treat the patient with weekly for possible for 4 weeks. She does have followup with dermatology for this.  I explained the diagnosis and have given explicit precautions to return to the ER including any other new or  worsening symptoms. The patient understands and accepts the medical plan as it's been dictated and I have answered their questions. Discharge instructions concerning home care and prescriptions have been given.  The patient is STABLE and is discharged to home in good condition.          Jones Skene, MD 07/20/12 1338

## 2012-09-13 ENCOUNTER — Emergency Department (HOSPITAL_BASED_OUTPATIENT_CLINIC_OR_DEPARTMENT_OTHER): Payer: Self-pay

## 2012-09-13 ENCOUNTER — Emergency Department (HOSPITAL_BASED_OUTPATIENT_CLINIC_OR_DEPARTMENT_OTHER)
Admission: EM | Admit: 2012-09-13 | Discharge: 2012-09-14 | Disposition: A | Discharge status: Home / Self Care | Payer: Self-pay | Attending: Emergency Medicine | Admitting: Emergency Medicine

## 2012-09-13 ENCOUNTER — Encounter (HOSPITAL_BASED_OUTPATIENT_CLINIC_OR_DEPARTMENT_OTHER): Payer: Self-pay | Admitting: *Deleted

## 2012-09-13 DIAGNOSIS — Z3201 Encounter for pregnancy test, result positive: Secondary | ICD-10-CM | POA: Insufficient documentation

## 2012-09-13 DIAGNOSIS — Z79899 Other long term (current) drug therapy: Secondary | ICD-10-CM | POA: Insufficient documentation

## 2012-09-13 DIAGNOSIS — Z3490 Encounter for supervision of normal pregnancy, unspecified, unspecified trimester: Secondary | ICD-10-CM

## 2012-09-13 DIAGNOSIS — Z7982 Long term (current) use of aspirin: Secondary | ICD-10-CM | POA: Insufficient documentation

## 2012-09-13 DIAGNOSIS — O99330 Smoking (tobacco) complicating pregnancy, unspecified trimester: Secondary | ICD-10-CM | POA: Insufficient documentation

## 2012-09-13 LAB — URINALYSIS, ROUTINE W REFLEX MICROSCOPIC
Leukocytes, UA: NEGATIVE
Nitrite: NEGATIVE
Specific Gravity, Urine: 1.027 (ref 1.005–1.030)
Urobilinogen, UA: 0.2 mg/dL (ref 0.0–1.0)
pH: 5.5 (ref 5.0–8.0)

## 2012-09-13 LAB — WET PREP, GENITAL
Trich, Wet Prep: NONE SEEN
Yeast Wet Prep HPF POC: NONE SEEN

## 2012-09-13 LAB — CBC WITH DIFFERENTIAL/PLATELET
Eosinophils Absolute: 0.1 10*3/uL (ref 0.0–0.7)
Hemoglobin: 11.3 g/dL — ABNORMAL LOW (ref 12.0–15.0)
Lymphocytes Relative: 30 % (ref 12–46)
Lymphs Abs: 1.9 10*3/uL (ref 0.7–4.0)
MCH: 30.5 pg (ref 26.0–34.0)
MCV: 89.2 fL (ref 78.0–100.0)
Monocytes Relative: 11 % (ref 3–12)
Neutrophils Relative %: 57 % (ref 43–77)
RBC: 3.71 MIL/uL — ABNORMAL LOW (ref 3.87–5.11)
WBC: 6.4 10*3/uL (ref 4.0–10.5)

## 2012-09-13 MED ORDER — SODIUM CHLORIDE 0.9 % IV BOLUS (SEPSIS)
1000.0000 mL | Freq: Once | INTRAVENOUS | Status: AC
  Administered 2012-09-13: 1000 mL via INTRAVENOUS

## 2012-09-13 NOTE — ED Notes (Signed)
Patient transported to Ultrasound 

## 2012-09-13 NOTE — Discharge Instructions (Signed)
ABCs of Pregnancy  A  Antepartum care is very important. Be sure you see your doctor and get prenatal care as soon as you think you are pregnant. At this time, you will be tested for infection, genetic abnormalities and potential problems with you and the pregnancy. This is the time to discuss diet, exercise, work, medications, labor, pain medication during labor and the possibility of a cesarean delivery. Ask any questions that may concern you. It is important to see your doctor regularly throughout your pregnancy. Avoid exposure to toxic substances and chemicals - such as cleaning solvents, lead and mercury, some insecticides, and paint. Pregnant women should avoid exposure to paint fumes, and fumes that cause you to feel ill, dizzy or faint. When possible, it is a good idea to have a pre-pregnancy consultation with your caregiver to begin some important recommendations your caregiver suggests such as, taking folic acid, exercising, quitting smoking, avoiding alcoholic beverages, etc.  B  Breastfeeding is the healthiest choice for both you and your baby. It has many nutritional benefits for the baby and health benefits for the mother. It also creates a very tight and loving bond between the baby and mother. Talk to your doctor, your family and friends, and your employer about how you choose to feed your baby and how they can support you in your decision. Not all birth defects can be prevented, but a woman can take actions that may increase her chance of having a healthy baby. Many birth defects happen very early in pregnancy, sometimes before a woman even knows she is pregnant. Birth defects or abnormalities of any child in your or the father's family should be discussed with your caregiver. Get a good support bra as your breast size changes. Wear it especially when you exercise and when nursing.   C  Celebrate the news of your pregnancy with the your spouse/father and family. Childbirth classes are helpful to  take for you and the spouse/father because it helps to understand what happens during the pregnancy, labor and delivery. Cesarean delivery should be discussed with your doctor so you are prepared for that possibility. The pros and cons of circumcision if it is a boy, should be discussed with your pediatrician. Cigarette smoking during pregnancy can result in low birth weight babies. It has been associated with infertility, miscarriages, tubal pregnancies, infant death (mortality) and poor health (morbidity) in childhood. Additionally, cigarette smoking may cause long-term learning disabilities. If you smoke, you should try to quit before getting pregnant and not smoke during the pregnancy. Secondary smoke may also harm a mother and her developing baby. It is a good idea to ask people to stop smoking around you during your pregnancy and after the baby is born. Extra calcium is necessary when you are pregnant and is found in your prenatal vitamin, in dairy products, green leafy vegetables and in calcium supplements.  D  A healthy diet according to your current weight and height, along with vitamins and mineral supplements should be discussed with your caregiver. Domestic abuse or violence should be made known to your doctor right away to get the situation corrected. Drink more water when you exercise to keep hydrated. Discomfort of your back and legs usually develops and progresses from the middle of the second trimester through to delivery of the baby. This is because of the enlarging baby and uterus, which may also affect your balance. Do not take illegal drugs. Illegal drugs can seriously harm the baby and you. Drink extra   fluids (water is best) throughout pregnancy to help your body keep up with the increases in your blood volume. Drink at least 6 to 8 glasses of water, fruit juice, or milk each day. A good way to know you are drinking enough fluid is when your urine looks almost like clear water or is very light  yellow.   E  Eat healthy to get the nutrients you and your unborn baby need. Your meals should include the five basic food groups. Exercise (30 minutes of light to moderate exercise a day) is important and encouraged during pregnancy, if there are no medical problems or problems with the pregnancy. Exercise that causes discomfort or dizziness should be stopped and reported to your caregiver. Emotions during pregnancy can change from being ecstatic to depression and should be understood by you, your partner and your family.  F  Fetal screening with ultrasound, amniocentesis and monitoring during pregnancy and labor is common and sometimes necessary. Take 400 micrograms of folic acid daily both before, when possible, and during the first few months of pregnancy to reduce the risk of birth defects of the brain and spine. All women who could possibly become pregnant should take a vitamin with folic acid, every day. It is also important to eat a healthy diet with fortified foods (enriched grain products, including cereals, rice, breads, and pastas) and foods with natural sources of folate (orange juice, green leafy vegetables, beans, peanuts, broccoli, asparagus, peas, and lentils). The father should be involved with all aspects of the pregnancy including, the prenatal care, childbirth classes, labor, delivery, and postpartum time. Fathers may also have emotional concerns about being a father, financial needs, and raising a family.  G  Genetic testing should be done appropriately. It is important to know your family and the father's history. If there have been problems with pregnancies or birth defects in your family, report these to your doctor. Also, genetic counselors can talk with you about the information you might need in making decisions about having a family. You can call a major medical center in your area for help in finding a board-certified genetic counselor. Genetic testing and counseling should be done  before pregnancy when possible, especially if there is a history of problems in the mother's or father's family. Certain ethnic backgrounds are more at risk for genetic defects.  H  Get familiar with the hospital where you will be having your baby. Get to know how long it takes to get there, the labor and delivery area, and the hospital procedures. Be sure your medical insurance is accepted there. Get your home ready for the baby including, clothes, the baby's room (when possible), furniture and car seat. Hand washing is important throughout the day, especially after handling raw meat and poultry, changing the baby's diaper or using the bathroom. This can help prevent the spread of many bacteria and viruses that cause infection. Your hair may become dry and thinner, but will return to normal a few weeks after the baby is born. Heartburn is a common problem that can be treated by taking antacids recommended by your caregiver, eating smaller meals 5 or 6 times a day, not drinking liquids when eating, drinking between meals and raising the head of your bed 2 to 3 inches.  I  Insurance to cover you, the baby, doctor and hospital should be reviewed so that you will be prepared to pay any costs not covered by your insurance plan. If you do not have medical insurance,   there are usually clinics and services available for you in your community. Take 30 milligrams of iron during your pregnancy as prescribed by your doctor to reduce the risk of low red blood cells (anemia) later in pregnancy. All women of childbearing age should eat a diet rich in iron.  J  There should be a joint effort for the mother, father and any other children to adapt to the pregnancy financially, emotionally, and psychologically during the pregnancy. Join a support group for moms-to-be. Or, join a class on parenting or childbirth. Have the family participate when possible.  K  Know your limits. Let your caregiver know if you experience any of the  following:   · Pain of any kind.  · Strong cramps.  · You develop a lot of weight in a short period of time (5 pounds in 3 to 5 days).  · Vaginal bleeding, leaking of amniotic fluid.  · Headache, vision problems.  · Dizziness, fainting, shortness of breath.  · Chest pain.  · Fever of 102° F (38.9° C) or higher.  · Gush of clear fluid from your vagina.  · Painful urination.  · Domestic violence.  · Irregular heartbeat (palpitations).  · Rapid beating of the heart (tachycardia).  · Constant feeling sick to your stomach (nauseous) and vomiting.  · Trouble walking, fluid retention (edema).  · Muscle weakness.  · If your baby has decreased activity.  · Persistent diarrhea.  · Abnormal vaginal discharge.  · Uterine contractions at 20-minute intervals.  · Back pain that travels down your leg.  L  Learn and practice that what you eat and drink should be in moderation and healthy for you and your baby. Legal drugs such as alcohol and caffeine are important issues for pregnant women. There is no safe amount of alcohol a woman can drink while pregnant. Fetal alcohol syndrome, a disorder characterized by growth retardation, facial abnormalities, and central nervous system dysfunction, is caused by a woman's use of alcohol during pregnancy. Caffeine, found in tea, coffee, soft drinks and chocolate, should also be limited. Be sure to read labels when trying to cut down on caffeine during pregnancy. More than 200 foods, beverages, and over-the-counter medications contain caffeine and have a high salt content! There are coffees and teas that do not contain caffeine.  M  Medical conditions such as diabetes, epilepsy, and high blood pressure should be treated and kept under control before pregnancy when possible, but especially during pregnancy. Ask your caregiver about any medications that may need to be changed or adjusted during pregnancy. If you are currently taking any medications, ask your caregiver if it is safe to take them  while you are pregnant or before getting pregnant when possible. Also, be sure to discuss any herbs or vitamins you are taking. They are medicines, too! Discuss with your doctor all medications, prescribed and over-the-counter, that you are taking. During your prenatal visit, discuss the medications your doctor may give you during labor and delivery.  N  Never be afraid to ask your doctor or caregiver questions about your health, the progress of the pregnancy, family problems, stressful situations, and recommendation for a pediatrician, if you do not have one. It is better to take all precautions and discuss any questions or concerns you may have during your office visits. It is a good idea to write down your questions before you visit the doctor.  O  Over-the-counter cough and cold remedies may contain alcohol or other ingredients that should   be avoided during pregnancy. Ask your caregiver about prescription, herbs or over-the-counter medications that you are taking or may consider taking while pregnant.   P  Physical activity during pregnancy can benefit both you and your baby by lessening discomfort and fatigue, providing a sense of well-being, and increasing the likelihood of early recovery after delivery. Light to moderate exercise during pregnancy strengthens the belly (abdominal) and back muscles. This helps improve posture. Practicing yoga, walking, swimming, and cycling on a stationary bicycle are usually safe exercises for pregnant women. Avoid scuba diving, exercise at high altitudes (over 3000 feet), skiing, horseback riding, contact sports, etc. Always check with your doctor before beginning any kind of exercise, especially during pregnancy and especially if you did not exercise before getting pregnant.  Q  Queasiness, stomach upset and morning sickness are common during pregnancy. Eating a couple of crackers or dry toast before getting out of bed. Foods that you normally love may make you feel sick to  your stomach. You may need to substitute other nutritious foods. Eating 5 or 6 small meals a day instead of 3 large ones may make you feel better. Do not drink with your meals, drink between meals. Questions that you have should be written down and asked during your prenatal visits.  R  Read about and make plans to baby-proof your home. There are important tips for making your home a safer environment for your baby. Review the tips and make your home safer for you and your baby. Read food labels regarding calories, salt and fat content in the food.  S  Saunas, hot tubs, and steam rooms should be avoided while you are pregnant. Excessive high heat may be harmful during your pregnancy. Your caregiver will screen and examine you for sexually transmitted diseases and genetic disorders during your prenatal visits. Learn the signs of labor. Sexual relations while pregnant is safe unless there is a medical or pregnancy problem and your caregiver advises against it.  T  Traveling long distances should be avoided especially in the third trimester of your pregnancy. If you do have to travel out of state, be sure to take a copy of your medical records and medical insurance plan with you. You should not travel long distances without seeing your doctor first. Most airlines will not allow you to travel after 36 weeks of pregnancy. Toxoplasmosis is an infection caused by a parasite that can seriously harm an unborn baby. Avoid eating undercooked meat and handling cat litter. Be sure to wear gloves when gardening. Tingling of the hands and fingers is not unusual and is due to fluid retention. This will go away after the baby is born.  U  Womb (uterus) size increases during the first trimester. Your kidneys will begin to function more efficiently. This may cause you to feel the need to urinate more often. You may also leak urine when sneezing, coughing or laughing. This is due to the growing uterus pressing against your bladder,  which lies directly in front of and slightly under the uterus during the first few months of pregnancy. If you experience burning along with frequency of urination or bloody urine, be sure to tell your doctor. The size of your uterus in the third trimester may cause a problem with your balance. It is advisable to maintain good posture and avoid wearing high heels during this time. An ultrasound of your baby may be necessary during your pregnancy and is safe for you and your baby.  V    Vaccinations are an important concern for pregnant women. Get needed vaccines before pregnancy. Center for Disease Control (www.cdc.gov) has clear guidelines for the use of vaccines during pregnancy. Review the list, be sure to discuss it with your doctor. Prenatal vitamins are helpful and healthy for you and the baby. Do not take extra vitamins except what is recommended. Taking too much of certain vitamins can cause overdose problems. Continuous vomiting should be reported to your caregiver. Varicose veins may appear especially if there is a family history of varicose veins. They should subside after the delivery of the baby. Support hose helps if there is leg discomfort.  W  Being overweight or underweight during pregnancy may cause problems. Try to get within 15 pounds of your ideal weight before pregnancy. Remember, pregnancy is not a time to be dieting! Do not stop eating or start skipping meals as your weight increases. Both you and your baby need the calories and nutrition you receive from a healthy diet. Be sure to consult with your doctor about your diet. There is a formula and diet plan available depending on whether you are overweight or underweight. Your caregiver or nutritionist can help and advise you if necessary.  X  Avoid X-rays. If you must have dental work or diagnostic tests, tell your dentist or physician that you are pregnant so that extra care can be taken. X-rays should only be taken when the risks of not taking  them outweigh the risk of taking them. If needed, only the minimum amount of radiation should be used. When X-rays are necessary, protective lead shields should be used to cover areas of the body that are not being X-rayed.  Y  Your baby loves you. Breastfeeding your baby creates a loving and very close bond between the two of you. Give your baby a healthy environment to live in while you are pregnant. Infants and children require constant care and guidance. Their health and safety should be carefully watched at all times. After the baby is born, rest or take a nap when the baby is sleeping.  Z  Get your ZZZs. Be sure to get plenty of rest. Resting on your side as often as possible, especially on your left side is advised. It provides the best circulation to your baby and helps reduce swelling. Try taking a nap for 30 to 45 minutes in the afternoon when possible. After the baby is born rest or take a nap when the baby is sleeping. Try elevating your feet for that amount of time when possible. It helps the circulation in your legs and helps reduce swelling.   Most information courtesy of the CDC.  Document Released: 08/02/2005 Document Revised: 10/25/2011 Document Reviewed: 04/16/2009  [employer]® Patient Information ©2013 [employer].

## 2012-09-13 NOTE — ED Notes (Signed)
Pt c/o lower  abd pain x 1 week with morning nausea  LMP dec 18

## 2012-09-13 NOTE — ED Provider Notes (Signed)
History     CSN: [id number]  Arrival date & time 09/13/12  2114   First MD Initiated Contact with Patient 09/13/12 2122      Chief Complaint  Patient presents with  . Abdominal Pain    (Consider location/radiation/quality/duration/timing/severity/associated sxs/prior treatment) HPI Comments: Patient is a 27 year old female with a 1 week history of lower abdominal pain. The pain is located in her lower abdomen and does not radiate. The pain is described as cramping and moderate. The pain started gradually and progressively worsened since the onset. No alleviating/aggravating factors. The patient has tried nothing for symptoms without relief. Associated symptoms include nausea. Patient denies fever, headache, vomiting, diarrhea, chest pain, SOB, dysuria, constipation, abnormal vaginal bleeding/discharge.     Patient is a 27 y.o. female presenting with abdominal pain.  Abdominal Pain The primary symptoms of the illness include abdominal pain and nausea.    History reviewed. No pertinent past medical history.  Past Surgical History  Procedure Date  . Cesarean section     History reviewed. No pertinent family history.  History  Substance Use Topics  . Smoking status: Current Every Day Smoker -- 0.5 packs/day    Types: Cigarettes  . Smokeless tobacco: Not on file  . Alcohol Use: Yes    OB History    Grav Para Term Preterm Abortions TAB SAB Ect Mult Living                  Review of Systems  Gastrointestinal: Positive for nausea and abdominal pain.  All other systems reviewed and are negative.    Allergies  Review of patient's allergies indicates no known allergies.  Home Medications   Current Outpatient Rx  Name  Route  Sig  Dispense  Refill  . ASPIRIN BUF(ALHYD-MGHYD-CACAR) PO   Oral   Take by mouth.         Marland Kitchen BUTALBITAL-APAP-CAFFEINE 50-325-40 MG PO TABS   Oral   Take 1-2 tablets by mouth every 6 (six) hours as needed for headache.   20 tablet   0    . CETIRIZINE HCL 10 MG PO CHEW   Oral   Chew 1 tablet (10 mg total) by mouth daily.   30 tablet   0   . FAMOTIDINE 20 MG PO TABS   Oral   Take 1 tablet (20 mg total) by mouth 2 (two) times daily.   30 tablet   0   . ADVIL PO   Oral   Take by mouth.         . IBUPROFEN 600 MG PO TABS   Oral   Take 1 tablet (600 mg total) by mouth every 6 (six) hours as needed for pain.   30 tablet   0   . METRONIDAZOLE 500 MG PO TABS   Oral   Take 1 tablet (500 mg total) by mouth 2 (two) times daily.   14 tablet   0   . PREDNISONE 20 MG PO TABS   Oral   Take 3 tablets (60 mg total) by mouth daily.   30 tablet   0     BP 135/78  Pulse 100  Temp 98.2 F (36.8 C) (Oral)  Resp 16  Ht 5\' 7"  (1.702 m)  Wt 140 lb (63.504 kg)  BMI 21.93 kg/m2  SpO2 100%  LMP 08/02/2012  Physical Exam  Nursing note and vitals reviewed. Constitutional: She is oriented to person, place, and time. She appears well-developed and well-nourished. No distress.  HENT:  Head: Normocephalic and atraumatic.  Eyes: Conjunctivae normal and EOM are normal.  Neck: Normal range of motion.  Cardiovascular: Normal rate and regular rhythm.  Exam reveals no gallop and no friction rub.   No murmur heard. Pulmonary/Chest: Effort normal and breath sounds normal. She has no wheezes. She has no rales. She exhibits no tenderness.  Abdominal: Soft. She exhibits no distension. There is tenderness. There is no rebound.       Suprapubic tenderness to palpation.   Genitourinary:       Copious, clear discharge.  Cervical motion tenderness.  Musculoskeletal: Normal range of motion.  Neurological: She is alert and oriented to person, place, and time.       Speech is goal-oriented. Moves limbs without ataxia.   Skin: Skin is warm and dry.  Psychiatric: She has a normal mood and affect. Her behavior is normal.    ED Course  Procedures (including critical care time)   Labs Reviewed  URINALYSIS, ROUTINE W REFLEX  MICROSCOPIC  PREGNANCY, URINE  WET PREP, GENITAL  GC/CHLAMYDIA PROBE AMP   No results found.   No diagnosis found.    MDM  9:38 PM Urinalysis, urine pregnancy, wet prep pending.   10:01 PM Patient signed out to Dr. Bernette Mayers.     Emilia Beck, PA-C 09/13/12 2201

## 2012-09-13 NOTE — ED Provider Notes (Signed)
Medical screening examination/treatment/procedure(s) were performed by non-physician practitioner and as supervising physician I was immediately available for consultation/collaboration.  Care signed out to Dr. Nicanor Alcon at the change of shift, pending [place] results.   Charles B. Bernette Mayers, MD 09/13/12 2312

## 2012-10-27 ENCOUNTER — Encounter (HOSPITAL_BASED_OUTPATIENT_CLINIC_OR_DEPARTMENT_OTHER): Payer: Self-pay

## 2012-10-27 ENCOUNTER — Emergency Department (HOSPITAL_BASED_OUTPATIENT_CLINIC_OR_DEPARTMENT_OTHER)
Admission: EM | Admit: 2012-10-27 | Discharge: 2012-10-27 | Disposition: A | Discharge status: Home / Self Care | Payer: Medicaid Other | Attending: Emergency Medicine | Admitting: Emergency Medicine

## 2012-10-27 DIAGNOSIS — Y929 Unspecified place or not applicable: Secondary | ICD-10-CM | POA: Insufficient documentation

## 2012-10-27 DIAGNOSIS — S61219A Laceration without foreign body of unspecified finger without damage to nail, initial encounter: Secondary | ICD-10-CM

## 2012-10-27 DIAGNOSIS — Z87891 Personal history of nicotine dependence: Secondary | ICD-10-CM | POA: Insufficient documentation

## 2012-10-27 DIAGNOSIS — W260XXA Contact with knife, initial encounter: Secondary | ICD-10-CM | POA: Insufficient documentation

## 2012-10-27 DIAGNOSIS — Y9389 Activity, other specified: Secondary | ICD-10-CM | POA: Insufficient documentation

## 2012-10-27 DIAGNOSIS — S61209A Unspecified open wound of unspecified finger without damage to nail, initial encounter: Secondary | ICD-10-CM | POA: Insufficient documentation

## 2012-10-27 DIAGNOSIS — W261XXA Contact with sword or dagger, initial encounter: Secondary | ICD-10-CM | POA: Insufficient documentation

## 2012-10-27 NOTE — ED Provider Notes (Signed)
History     CSN: [id number]  Arrival date & time 10/27/12  1207   First MD Initiated Contact with Patient 10/27/12 1322      Chief Complaint  Patient presents with  . Extremity Laceration    (Consider location/radiation/quality/duration/timing/severity/associated sxs/prior treatment) HPI Comments: Cut finger with knife while slicing lemons.  Lac to finger.  Patient unsure when last td was.  She is currently pregnant.  Patient is a 27 y.o. female presenting with skin laceration. The history is provided by the patient.  Laceration Location:  Finger Finger laceration location:  L index finger Depth:  Through dermis Quality: avulsion   Time since incident:  1 hour Laceration mechanism:  Knife Pain details:    Quality:  Throbbing   Severity:  Moderate   Timing:  Constant Relieved by:  Nothing   History reviewed. No pertinent past medical history.  Past Surgical History  Procedure Laterality Date  . Cesarean section      No family history on file.  History  Substance Use Topics  . Smoking status: Former Smoker -- 0.50 packs/day    Types: Cigarettes  . Smokeless tobacco: Not on file  . Alcohol Use: No    OB History   Grav Para Term Preterm Abortions TAB SAB Ect Mult Living   1               Review of Systems  All other systems reviewed and are negative.    Allergies  Review of patient's allergies indicates no known allergies.  Home Medications  No current outpatient prescriptions on file.  BP 94/49  Pulse 70  Temp(Src) 98.6 F (37 C) (Oral)  Resp 16  Ht 5\' 7"  (1.702 m)  Wt 153 lb (69.4 kg)  BMI 23.96 kg/m2  SpO2 100%  LMP 07/28/2012  Physical Exam  Nursing note and vitals reviewed. Constitutional: She is oriented to person, place, and time. She appears well-developed and well-nourished. No distress.  HENT:  Head: Normocephalic and atraumatic.  Mouth/Throat: Oropharynx is clear and moist.  Neck: Normal range of motion. Neck supple.   Musculoskeletal: Normal range of motion.  There is a 1 cm laceration to the lateral aspect of the left index finger.  It is located just distal to the nail.  Neurological: She is alert and oriented to person, place, and time.  Skin: Skin is warm and dry. She is not diaphoretic.    ED Course  Procedures (including critical care time)  Labs Reviewed - No data to display No results found.   No diagnosis found.    MDM  LACERATION REPAIR Performed by: Geoffery Lyons Authorized by: Geoffery Lyons Consent: Verbal consent obtained. Risks and benefits: risks, benefits and alternatives were discussed Consent given by: patient Patient identity confirmed: provided demographic data Prepped and Draped in normal sterile fashion Wound explored  Laceration Location: left index finger  Laceration Length: 1cm  No Foreign Bodies seen or palpated  Anesthesia: local infiltration  Local anesthetic: lidocaine none  Anesthetic total: 0 ml  Irrigation method: syringe Amount of cleaning: standard  Skin closure: dermabond  Number of sutures: dermabond  Technique: dermabond  Patient tolerance: Patient tolerated the procedure well with no immediate complications.   Follow up prn.         Geoffery Lyons, MD 10/27/12 1332

## 2012-10-27 NOTE — ED Notes (Signed)
Laceration to left index finger.   

## 2012-11-02 ENCOUNTER — Encounter (HOSPITAL_BASED_OUTPATIENT_CLINIC_OR_DEPARTMENT_OTHER): Payer: Self-pay

## 2012-11-02 ENCOUNTER — Emergency Department (HOSPITAL_BASED_OUTPATIENT_CLINIC_OR_DEPARTMENT_OTHER)
Admission: EM | Admit: 2012-11-02 | Discharge: 2012-11-02 | Disposition: A | Discharge status: Home / Self Care | Payer: Medicaid Other | Attending: Emergency Medicine | Admitting: Emergency Medicine

## 2012-11-02 DIAGNOSIS — T8131XA Disruption of external operation (surgical) wound, not elsewhere classified, initial encounter: Secondary | ICD-10-CM

## 2012-11-02 DIAGNOSIS — T8133XA Disruption of traumatic injury wound repair, initial encounter: Secondary | ICD-10-CM | POA: Insufficient documentation

## 2012-11-02 DIAGNOSIS — Y849 Medical procedure, unspecified as the cause of abnormal reaction of the patient, or of later complication, without mention of misadventure at the time of the procedure: Secondary | ICD-10-CM | POA: Insufficient documentation

## 2012-11-02 DIAGNOSIS — Z87891 Personal history of nicotine dependence: Secondary | ICD-10-CM | POA: Insufficient documentation

## 2012-11-02 MED ORDER — CLINDAMYCIN HCL 150 MG PO CAPS: 300.0000 mg | ORAL_CAPSULE | Freq: Three times a day (TID) | ORAL | Status: DC

## 2012-11-02 MED ORDER — CLINDAMYCIN HCL 150 MG PO CAPS
300.0000 mg | ORAL_CAPSULE | Freq: Once | ORAL | Status: AC
  Administered 2012-11-02: 300 mg via ORAL
  Filled 2012-11-02: qty 2

## 2012-11-02 NOTE — ED Notes (Signed)
Possible infection in left index finger.  Pt was seen here and treated on Friday after sustaining a laceration to affected area.

## 2012-11-02 NOTE — ED Provider Notes (Signed)
History     CSN: [id number]  Arrival date & time 11/02/12  1516   First MD Initiated Contact with Patient 11/02/12 1531      Chief Complaint  Patient presents with  . Wound Infection    (Consider location/radiation/quality/duration/timing/severity/associated sxs/prior treatment) HPI The patient presents 5 days after sustaining an injury to her left index finger, not concerned over wound dehiscence.  She was seen here after cutting the lateral distal edge of the finger.  She was treated with glue.  She notes that over the interval she was initially improving, but over the past days the wound seems to be separating, with increasing redness about the distal finger.  No new drainage.  No fevers, no chills, no distal loss of sensation or function.  History reviewed. No pertinent past medical history.  Past Surgical History  Procedure Laterality Date  . Cesarean section      No family history on file.  History  Substance Use Topics  . Smoking status: Former Smoker -- 0.50 packs/day    Types: Cigarettes  . Smokeless tobacco: Not on file  . Alcohol Use: No    OB History   Grav Para Term Preterm Abortions TAB SAB Ect Mult Living   1               Review of Systems  All other systems reviewed and are negative.    Allergies  Review of patient's allergies indicates no known allergies.  Home Medications   Current Outpatient Rx  Name  Route  Sig  Dispense  Refill  . clindamycin (CLEOCIN) 150 MG capsule   Oral   Take 2 capsules (300 mg total) by mouth 3 (three) times daily.   42 capsule   0     BP 117/66  Pulse 84  Temp(Src) 97.9 F (36.6 C) (Oral)  Resp 16  Wt 153 lb (69.4 kg)  BMI 23.96 kg/m2  SpO2 100%  LMP 07/28/2012  Physical Exam  Nursing note and vitals reviewed. Constitutional: She is oriented to person, place, and time. She appears well-developed and well-nourished. No distress.  HENT:  Head: Normocephalic and atraumatic.  Mouth/Throat: Oropharynx  is clear and moist.  Neck: Normal range of motion. Neck supple.  Musculoskeletal: Normal range of motion.  There is a 1 cm laceration to the lateral aspect of the left index finger that is slightly separated with tissue adhesive that is peeling away.  No pus, no active bleeding. The patient can flex / extend the digit. Sensation was intact.  Neurological: She is alert and oriented to person, place, and time.  Skin: Skin is warm and dry. She is not diaphoretic.    ED Course  Procedures (including critical care time)  Labs Reviewed - No data to display No results found.   1. Wound dehiscence, initial encounter     I reviewed the patient's chart and visit from last week.  MDM  This generally well-appearing feel presents one week after initial wound closure of a left lateral index finger laceration, not concerned over possible infection and wound dehiscent.  Though there is no pus, and the patient is afebrile, clearly in no distress, there is some erythema for which the patient will be started on antibiotics.  She was advised on soaking the wound, and given the duration of time since the initial injury repeat closure is not indicated.  She was given explicit return precautions, follow up instructions.     Gerhard Munch, MD 11/02/12 2351

## 2012-11-02 NOTE — ED Notes (Signed)
Pt wound cleansed and bandaid applied.

## 2013-04-01 ENCOUNTER — Encounter (HOSPITAL_BASED_OUTPATIENT_CLINIC_OR_DEPARTMENT_OTHER): Payer: Self-pay

## 2013-04-01 ENCOUNTER — Emergency Department (HOSPITAL_BASED_OUTPATIENT_CLINIC_OR_DEPARTMENT_OTHER)
Admission: EM | Admit: 2013-04-01 | Discharge: 2013-04-01 | Disposition: A | Discharge status: Home / Self Care | Payer: Medicaid Other | Attending: Obstetrics & Gynecology | Admitting: Obstetrics & Gynecology

## 2013-04-01 DIAGNOSIS — O4700 False labor before 37 completed weeks of gestation, unspecified trimester: Secondary | ICD-10-CM | POA: Insufficient documentation

## 2013-04-01 DIAGNOSIS — O99891 Other specified diseases and conditions complicating pregnancy: Secondary | ICD-10-CM | POA: Insufficient documentation

## 2013-04-01 DIAGNOSIS — R32 Unspecified urinary incontinence: Secondary | ICD-10-CM

## 2013-04-01 HISTORY — DX: Encounter for supervision of normal pregnancy, unspecified, unspecified trimester: Z34.90

## 2013-04-01 NOTE — ED Provider Notes (Signed)
CSN: [id number]     Arrival date & time 04/01/13  2022 History      Chief Complaint  Patient presents with  . Labor Eval    The history is provided by the patient. No language interpreter was used.    HPI Comments: Deanna Buckley is a 27 y.o. female who presents to the Emergency Department complaining of possible rupture of membranes. She is G4P3 at 34wks 4/7 days gestation with scheduled C-section at [facility] on Sept 18. She was urinating earlier today and noticed a large gush of fluid. She denies any bleeding but thinks she may have passed a mucous plug last week some time. She has had some sharp, shooting pains in her pelvic area but no definite contractions. She has had 3 prior c-sections and has not been through labor before so she is unsure what rupture of membranes or contractions feel like.      PCP- Dr. Arther Abbott   Past Medical History  Diagnosis Date  . Pregnancy    Past Surgical History  Procedure Laterality Date  . Cesarean section     No family history on file. History  Substance Use Topics  . Smoking status: Former Smoker -- 0.50 packs/day    Types: Cigarettes  . Smokeless tobacco: Not on file  . Alcohol Use: No   OB History   Grav Para Term Preterm Abortions TAB SAB Ect Mult Living   1              Review of Systems A complete 10 system review of systems was obtained and all systems are negative except as noted in the HPI and PMH.   Allergies  Review of patient's allergies indicates no known allergies.  Home Medications   Current Outpatient Rx  Name  Route  Sig  Dispense  Refill  . Prenatal Vit-Fe Fumarate-FA (PRENATAL MULTIVITAMIN) TABS tablet   Oral   Take 1 tablet by mouth daily at 12 noon.          Triage Vitals: BP 139/76  Pulse 126  Temp(Src) 97.8 F (36.6 C) (Oral)  Resp 20  SpO2 96%  LMP 07/28/2012  Physical Exam  Nursing note and vitals reviewed. Constitutional: She is oriented to person, place, and time. She appears  well-developed and well-nourished.  HENT:  Head: Normocephalic and atraumatic.  Eyes: EOM are normal. Pupils are equal, round, and reactive to light.  Neck: Normal range of motion. Neck supple.  Cardiovascular: Normal rate, normal heart sounds and intact distal pulses.   Pulmonary/Chest: Effort normal and breath sounds normal.  Abdominal: Bowel sounds are normal. She exhibits no distension. There is no tenderness.  Gravid, consistent with dates  Genitourinary:  Sterile cervix check at fingertip, 50% effaced, fetus is high  Musculoskeletal: Normal range of motion. She exhibits no edema and no tenderness.  Neurological: She is alert and oriented to person, place, and time. She has normal strength. No cranial nerve deficit or sensory deficit.  Skin: Skin is warm and dry. No rash noted.  Psychiatric: She has a normal mood and affect.    ED Course   Procedures (including critical care time)  DIAGNOSTIC STUDIES: Oxygen Saturation is 96% on RA, normal by my interpretation.    COORDINATION OF CARE: 8:44 PM- Pt advised of plan for treatment and pt agrees.    Labs Reviewed - No data to display  No results found.  1. Preterm premature rupture of membranes, third trimester     MDM  Spoke  with Dr. Delford Field on call for patient's OB Dr. Shawnie Pons, he is concerned about lack of NICU at [facility] and recommends she be transferred to [facility]. Spoke with Dr. Penne Lash who will accept the patient for transfer. She is on FHT and toco monitors with normal fetal HR and no contractions on initial inspection. Will leave her on the monitor until evaluation at MAU.      Rasheen Bells B. Bernette Mayers, MD 04/01/13 2059

## 2013-04-01 NOTE — ED Notes (Signed)
Patient here with feeling that water broke while urinating. Denies noting any blood in toliet and has had no further discharge. Patient is G4 P3, 35 weeks with c-section scheduled 9/18. Placed immediately on toco monitor.

## 2013-04-01 NOTE — Progress Notes (Signed)
RECEIVED CALL FROM [facility]-RN THAT PT THERE WITH R/O SROM, G4/P3, 34 4/7, SCHEDULED FOR C/S 05/03/13 AT [facility]; ED-PHYSICIAN PERFORMED SVE-1CM; UNABLE TO TELL IF PT IS SROM, UNABLE TO SEE FHR VIA OBIX; TROUBLESHOOTING WITH RN/STAFF AT [facility] FOR ABOUT 30 MINUTES AND UNABLE TO SOLVE PROBLEM;ED RN WILL FAX FHR STRIP; EDRN SAYS THAT FHR IS 145-155 WITH NO DECELS, LOOKS LIKE PT HAS HAD A FEW UC'S; ED-PHYSICIAN SPOKE WITH DR LEGGETT, PLAN TO TRANSFER PT TO [facility]IA [facility] TO BE ASSESSED(NOT TRANSFERRING TO [facility] DUE TO PRETERM-34 4/7)  RROB SPOKE WITH [facility] CHARGE RN AND "I-T" IS BEING NOTIFIED OF PROBLEM WITH OBIX

## 2013-04-01 NOTE — MAU Provider Note (Signed)
  History     CSN: [id number]  Arrival date and time: 04/01/13 2212   None     Chief Complaint  Patient presents with  . Labor Eval   HPI  Deanna Buckley is a 27 y.o. [phone] at [redacted]w[redacted]d who presents complaining of fluid leaking. Around 7:00 tonight she urinated and when she stood up, more liquid came out. More liquid has not come out since that time. No bleeding. No contractions or cramping. Baby has moved less than normal tonight, the last time she felt baby move was around 8-8:30pm.   OB History   Grav Para Term Preterm Abortions TAB SAB Ect Mult Living   4 3 2 1      3       Past Medical History  Diagnosis Date  . Pregnancy     Past Surgical History  Procedure Laterality Date  . Cesarean section      History reviewed. No pertinent family history.  History  Substance Use Topics  . Smoking status: Former Smoker -- 0.50 packs/day    Types: Cigarettes  . Smokeless tobacco: Not on file  . Alcohol Use: No    Allergies: No Known Allergies  Prescriptions prior to admission  Medication Sig Dispense Refill  . Prenatal Vit-Fe Fumarate-FA (PRENATAL MULTIVITAMIN) TABS tablet Take 1 tablet by mouth daily at 12 noon.        ROS Physical Exam   Blood pressure 103/60, pulse 97, temperature 98.2 F (36.8 C), temperature source Oral, resp. rate 18, last menstrual period 07/28/2012, SpO2 100.00%.  Physical Exam Gen: NAD Lungs: NWOB Neuro: grossly nonfocal, speech intact GU: normal appearing external genitalia. Sterile speculum exam shows no pooling in vaginal vault. Cervix is visually closed.    MAU Course  Procedures  MDM Fern slide negative  FHR: baseline 130, moderate variability, + accels, no decels Toco: mild irritability, no regular ctx  Assessment and Plan  Deanna Buckley is a 27 y.o. [id number] at [redacted]w[redacted]d who presents for rule out rupture of membranes. No pooling on speculum exam. Fern slide is negative.  No evidence of rupture at this time. FHR category I. Safe  for discharge to home.  Levert Feinstein, MD Family Medicine PGY-2  04/01/2013, 10:42 PM   I have seen and examined this patient and agree the above assessment. CRESENZO-DISHMAN,Shailey Butterbaugh 04/01/2013 11:41 PM

## 2013-04-01 NOTE — ED Notes (Signed)
[facility] here to transfer to [facility], all strips have been faxed to [facility] and patient denis contractions or feeling any pressure at present. Katie updated on fetal HR and patients most recent vital signs.

## 2013-04-01 NOTE — ED Notes (Signed)
Rapid Response RN at [facility] notified of patient arrival and continues to troubleshoot the visualization issues with the fetal monitor. Strips are manually running on paper and will be faxed to [facility]. Preparations being made by MD to transfer to [facility].

## 2013-04-01 NOTE — ED Notes (Signed)
All belongings with patient and taken by [facility] to [facility]U.

## 2013-04-01 NOTE — ED Notes (Signed)
MD at bedside. 

## 2013-04-02 NOTE — MAU Provider Note (Signed)
Attestation of Attending Supervision of Advanced Practitioner (CNM/NP): Evaluation and management procedures were performed by the Advanced Practitioner under my supervision and collaboration. I have reviewed the Advanced Practitioner's note and chart, and I agree with the management and plan.  Hoang Reich H. 8:36 AM

## 2013-09-04 ENCOUNTER — Ambulatory Visit: Payer: Medicaid Other | Admitting: Physical Therapy

## 2013-11-30 ENCOUNTER — Ambulatory Visit (INDEPENDENT_AMBULATORY_CARE_PROVIDER_SITE_OTHER): Payer: Medicaid Other | Admitting: Podiatry

## 2013-11-30 ENCOUNTER — Encounter: Payer: Self-pay | Admitting: Podiatry

## 2013-11-30 VITALS — BP 116/74 | HR 82

## 2013-11-30 DIAGNOSIS — M21969 Unspecified acquired deformity of unspecified lower leg: Secondary | ICD-10-CM

## 2013-11-30 DIAGNOSIS — M216X9 Other acquired deformities of unspecified foot: Secondary | ICD-10-CM

## 2013-11-30 DIAGNOSIS — M7750 Other enthesopathy of unspecified foot: Secondary | ICD-10-CM

## 2013-11-30 DIAGNOSIS — M79609 Pain in unspecified limb: Secondary | ICD-10-CM

## 2013-11-30 DIAGNOSIS — M7740 Metatarsalgia, unspecified foot: Secondary | ICD-10-CM | POA: Insufficient documentation

## 2013-11-30 MED ORDER — NABUMETONE 500 MG PO TABS: 500.0000 mg | ORAL_TABLET | Freq: Two times a day (BID) | ORAL | Status: AC

## 2013-11-30 NOTE — Patient Instructions (Signed)
Seen for left foot pain. Noted of weakened first Metatarsal bone. Placed in Ankle support. Need Orthotics. Return in 2 weeks.

## 2013-11-30 NOTE — Progress Notes (Signed)
Subjective: 28 year old female presents complaining of pain left foot, which started 6 month ago and progressed gradually off and on.  Now it is daily consistnt pain at lateral and medial column of left foot. 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  Patient is accompanied by her 616 month old baby girl.   Review of Systems - General ROS: negative for - chills, fatigue, fever, sleep disturbance, weight gain or weight loss Ophthalmic ROS: negative ENT ROS: negative Allergy and Immunology ROS: negative Breast ROS: negative for breast lumps Respiratory ROS: no cough, shortness of breath, or wheezing Cardiovascular ROS: no chest pain or dyspnea on exertion Gastrointestinal ROS: no abdominal pain, change in bowel habits, or black or bloody stools Genito-Urinary ROS: no dysuria, trouble voiding, or hematuria Musculoskeletal ROS: negative Neurological ROS: negative Dermatological ROS: negative.  Objective: High Arch cavus type foot. Weak first ray with excess sagittal plane motion bilateral.  Normal neurovascular status. No edema or erythema noted. No increase in temperature. No abnormal skin lesions.  Assessment: Compensated rearfoot varus. Metatarsalgia. Lateral and medial column pain left foot with prolonged weight bearing.  Plan: Reviewed clinical findings and available treatment options. Ankle support dispensed. May benefit from Orthotic shoe inserts.

## 2013-12-14 ENCOUNTER — Ambulatory Visit: Payer: Medicaid Other | Admitting: Podiatry

## 2013-12-20 ENCOUNTER — Emergency Department (HOSPITAL_BASED_OUTPATIENT_CLINIC_OR_DEPARTMENT_OTHER)
Admission: EM | Admit: 2013-12-20 | Discharge: 2013-12-20 | Disposition: A | Discharge status: Home / Self Care | Payer: Medicaid Other | Attending: Emergency Medicine | Admitting: Emergency Medicine

## 2013-12-20 ENCOUNTER — Encounter (HOSPITAL_BASED_OUTPATIENT_CLINIC_OR_DEPARTMENT_OTHER): Payer: Self-pay | Admitting: Emergency Medicine

## 2013-12-20 DIAGNOSIS — R05 Cough: Secondary | ICD-10-CM

## 2013-12-20 DIAGNOSIS — R059 Cough, unspecified: Secondary | ICD-10-CM | POA: Insufficient documentation

## 2013-12-20 DIAGNOSIS — Z87891 Personal history of nicotine dependence: Secondary | ICD-10-CM | POA: Insufficient documentation

## 2013-12-20 DIAGNOSIS — H9209 Otalgia, unspecified ear: Secondary | ICD-10-CM | POA: Insufficient documentation

## 2013-12-20 DIAGNOSIS — Z79899 Other long term (current) drug therapy: Secondary | ICD-10-CM | POA: Insufficient documentation

## 2013-12-20 MED ORDER — ALBUTEROL SULFATE HFA 108 (90 BASE) MCG/ACT IN AERS
2.0000 | INHALATION_SPRAY | Freq: Once | RESPIRATORY_TRACT | Status: AC
  Administered 2013-12-20: 2 via RESPIRATORY_TRACT
  Filled 2013-12-20: qty 6.7

## 2013-12-20 NOTE — Discharge Instructions (Signed)
Cough, Adult  A cough is a reflex. It helps you clear your throat and airways. A cough can help heal your body. A cough can last 2 or 3 weeks (acute) or may last more than 8 weeks (chronic). Some common causes of a cough can include an infection, allergy, or a cold. HOME CARE  Only take medicine as told by your doctor.  If given, take your medicines (antibiotics) as told. Finish them even if you start to feel better.  Use a cold steam vaporizer or humidier in your home. This can help loosen thick spit (secretions).  Sleep so you are almost sitting up (semi-upright). Use pillows to do this. This helps reduce coughing.  Rest as needed.  Stop smoking if you smoke. GET HELP RIGHT AWAY IF:  You have yellowish-white fluid (pus) in your thick spit.  Your cough gets worse.  Your medicine does not reduce coughing, and you are losing sleep.  You cough up blood.  You have trouble breathing.  Your pain gets worse and medicine does not help.  You have a fever. MAKE SURE YOU:   Understand these instructions.  Will watch your condition.  Will get help right away if you are not doing well or get worse. Document Released: 04/15/2011 Document Revised: 10/25/2011 Document Reviewed: 04/15/2011 Exit[employer] Patient Information 2014 [employer].  

## 2013-12-20 NOTE — ED Notes (Signed)
Pt amb to room 2 with quick steady gait in nad. Pt reports bilateral ear pain "fullness", cough and congestion x Monday.

## 2013-12-20 NOTE — ED Provider Notes (Signed)
CSN: [id number]     Arrival date & time 12/20/13  [id number] History   First MD Initiated Contact with Patient 12/20/13 [phone]     Chief Complaint  Patient presents with  . Nasal Congestion  . Cough  . Otalgia     Patient is a 28 y.o. female presenting with cough and ear pain. The history is provided by the patient.  Cough Severity:  Moderate Onset quality:  Gradual Duration:  2 days Timing:  Intermittent Progression:  Worsening Chronicity:  New Smoker: yes   Relieved by:  Nothing Worsened by:  Nothing tried Associated symptoms: ear pain and sinus congestion   Otalgia Associated symptoms: cough     Past Medical History  Diagnosis Date  . Pregnancy    Past Surgical History  Procedure Laterality Date  . Cesarean section     History reviewed. No pertinent family history. History  Substance Use Topics  . Smoking status: Former Smoker -- 0.50 packs/day    Types: Cigarettes  . Smokeless tobacco: Never Used  . Alcohol Use: No   OB History   Grav Para Term Preterm Abortions TAB SAB Ect Mult Living   4 3 2 1      3      Review of Systems  HENT: Positive for ear pain.   Respiratory: Positive for cough.       Allergies  Review of patient's allergies indicates no known allergies.  Home Medications   Prior to Admission medications   Medication Sig Start Date End Date Taking? Authorizing Provider  Multiple Vitamin (MULTIVITAMIN) tablet Take 1 tablet by mouth daily.    Historical Provider, MD  nabumetone (RELAFEN) 500 MG tablet Take 1 tablet (500 mg total) by mouth 2 (two) times daily. 11/30/13   Myeong Sheard, DPM   BP 130/77  Pulse 93  Temp(Src) 98.3 F (36.8 C) (Oral)  Resp 18  SpO2 96%  LMP 12/19/2013  Breastfeeding? Unknown Physical Exam CONSTITUTIONAL: Well developed/well nourished, pt is drinking coffee HEAD: Normocephalic/atraumatic EYES: EOMI/PERRL ENMT: Mucous membranes moist, uvula midline, no erythema or exudate, bilateral TMs clear NECK: supple no  meningeal signs CV: S1/S2 noted, no murmurs/rubs/gallops noted LUNGS: Lungs are clear to auscultation bilaterally, no apparent distress ABDOMEN: soft, nontender, no rebound or guarding NEURO: Pt is awake/alert, moves all extremitiesx4 EXTREMITIES: pulses normal, full ROM SKIN: warm, color normal PSYCH: no abnormalities of mood noted  ED Course  Procedures (including critical care time) Pt well appearing Will give albuterol mdi She is not pregnant and no breastfeeding.   Stable for d/c home  MDM   Final diagnoses:  Cough    Nursing notes including past medical history and social history reviewed and considered in documentation     Joya Gaskinsonald W Haiven Nardone, MD 12/20/13 [phone]

## 2014-06-05 IMAGING — US US OB TRANSVAGINAL
1 series · 14 of 28 positions shown · non-contrast
Comparison: None.

CLINICAL DATA: Left lower quadrant pain

OBSTETRIC <14 WK US AND TRANSVAGINAL OB US
TECHNIQUE: Both transabdominal and transvaginal ultrasound
examinations were performed for complete evaluation of the
gestation as well as the maternal uterus, adnexal regions, and
pelvic cul-de-sac.  Transvaginal technique was performed to assess
early pregnancy.

[Series 1: us ob transvaginal · 0.26mm/px · 14 of 69 slices shown]
[im 3/69]
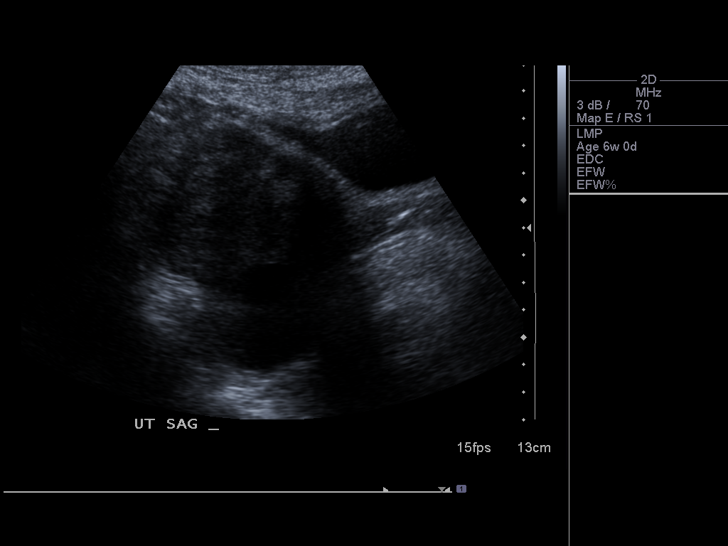
[im 8/69]
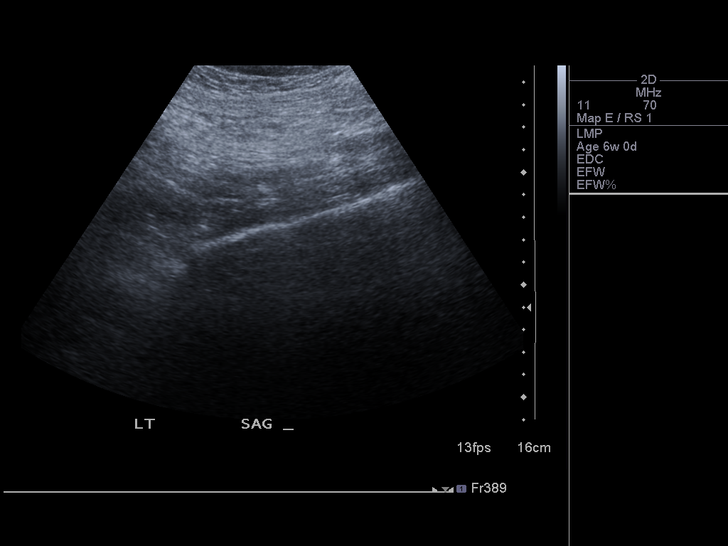
[im 13/69]
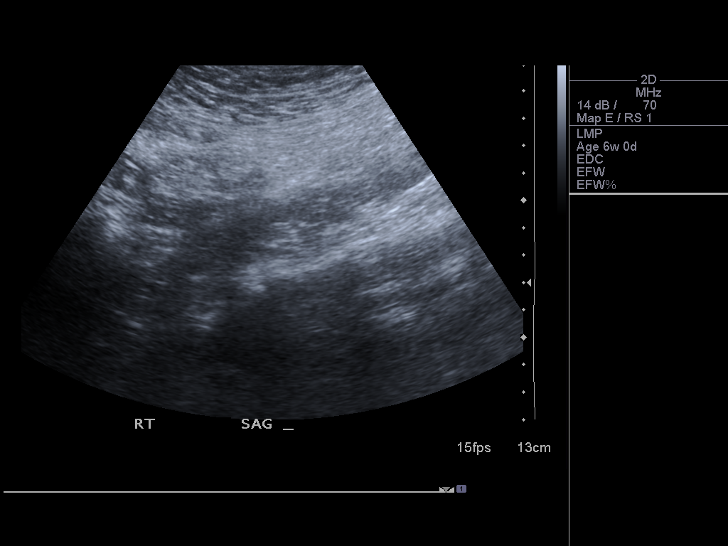
[im 18/69]
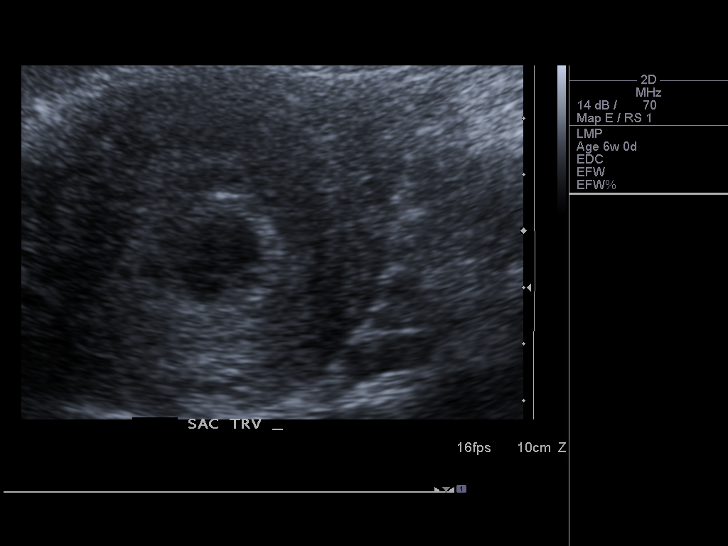
[im 23/69]
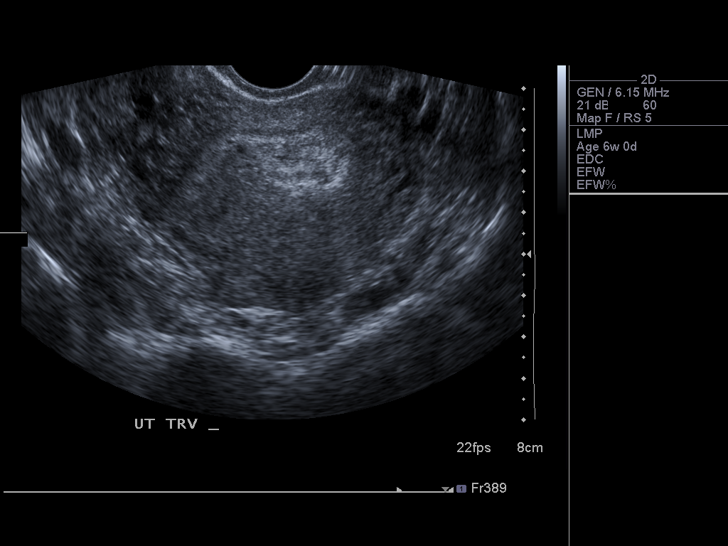
[im 28/69]
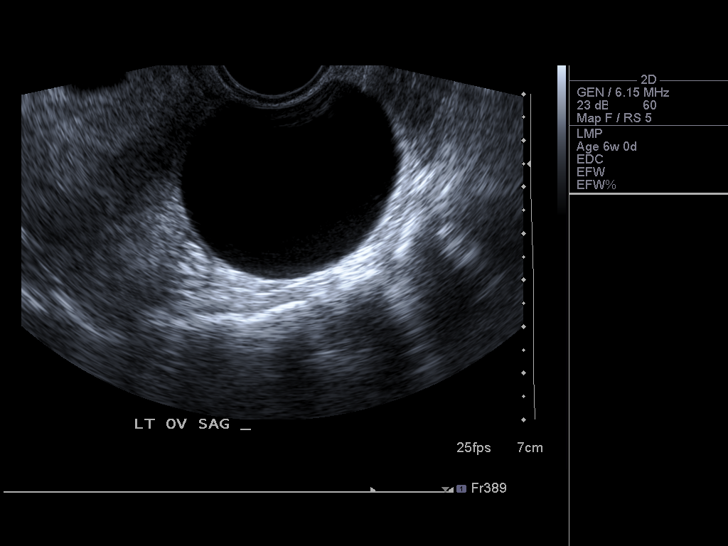
[im 33/69]
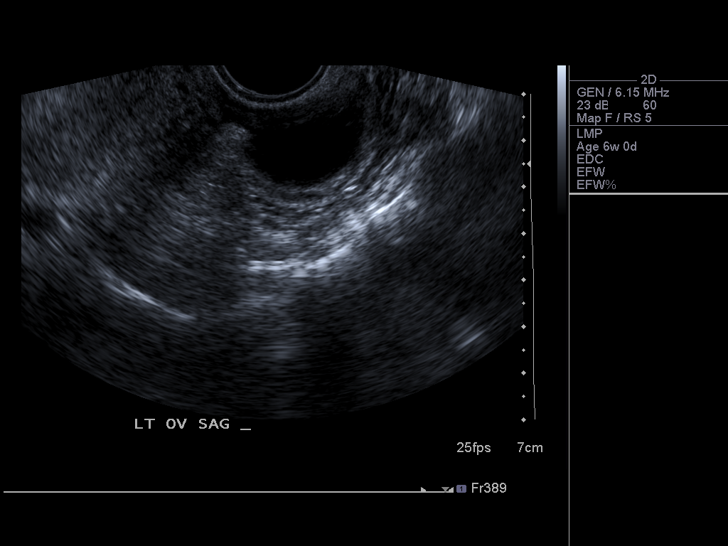
[im 38/69]
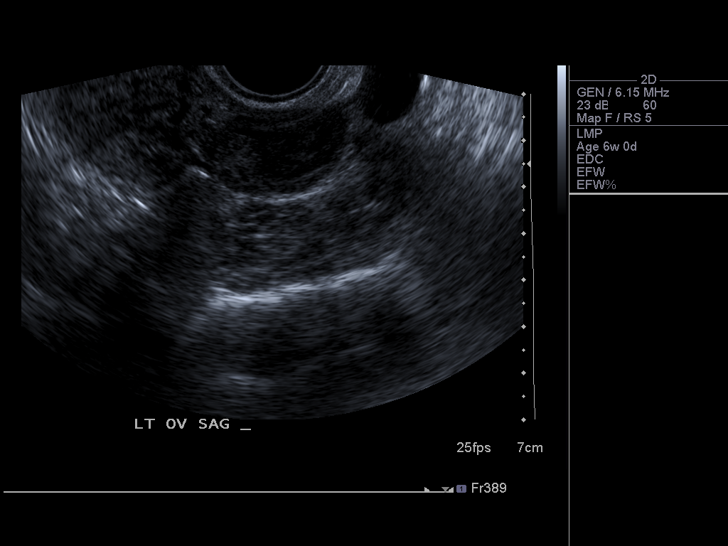
[im 43/69]
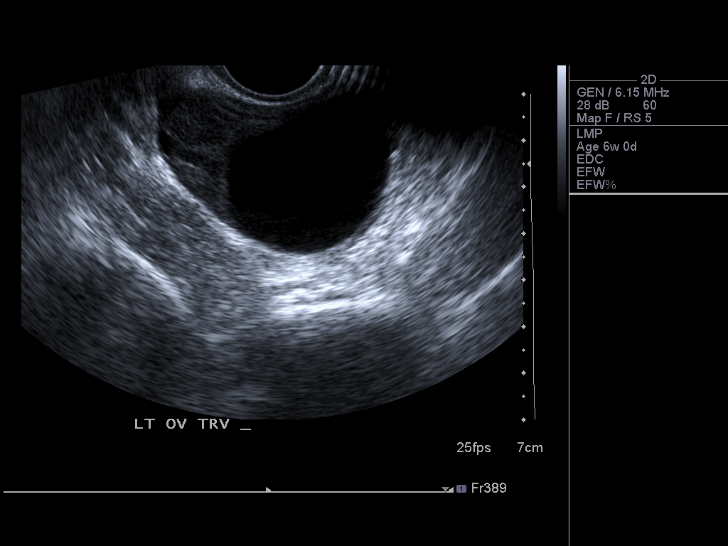
[im 48/69]
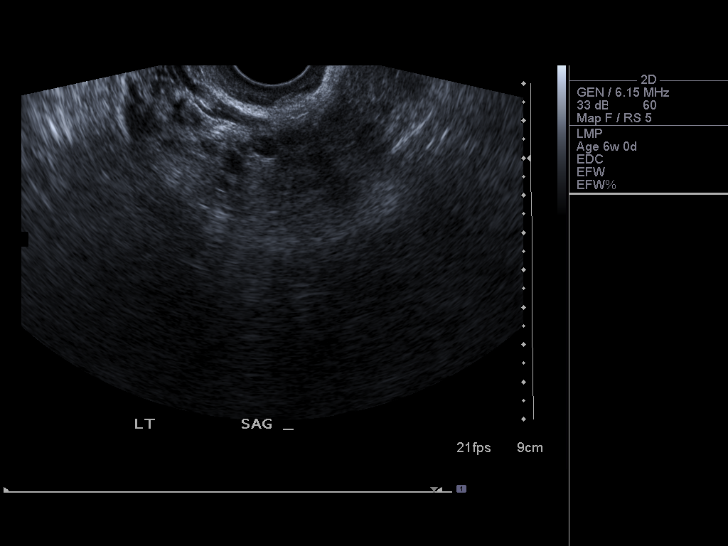
[im 53/69]
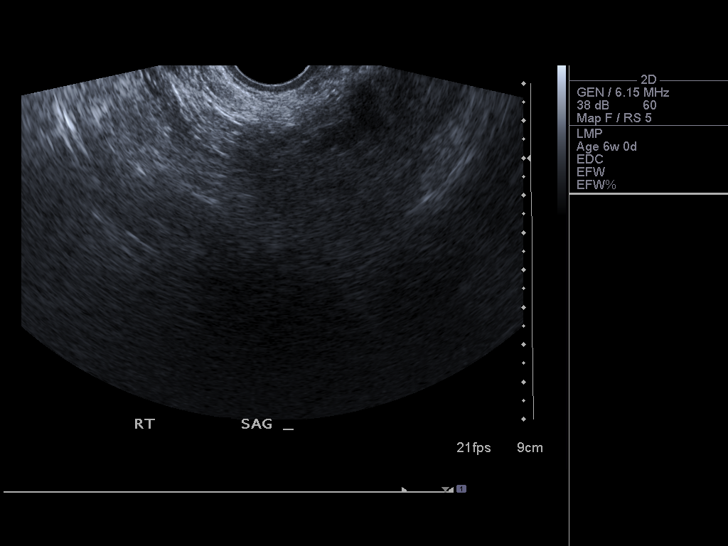
[im 58/69]
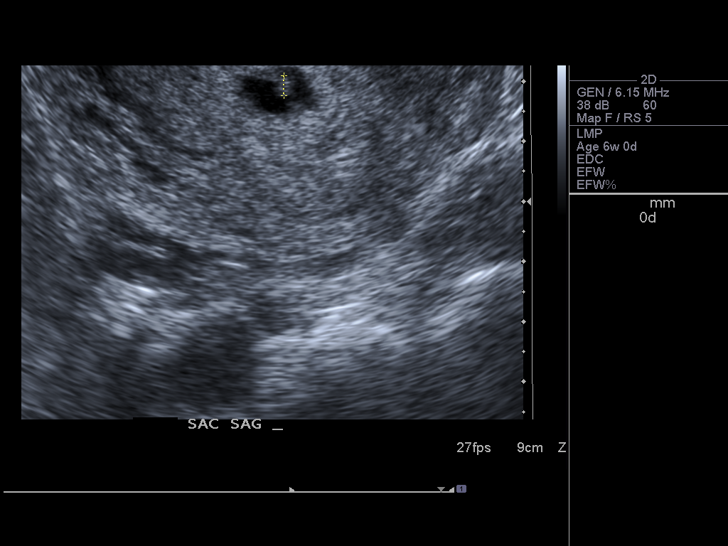
[im 63/69]
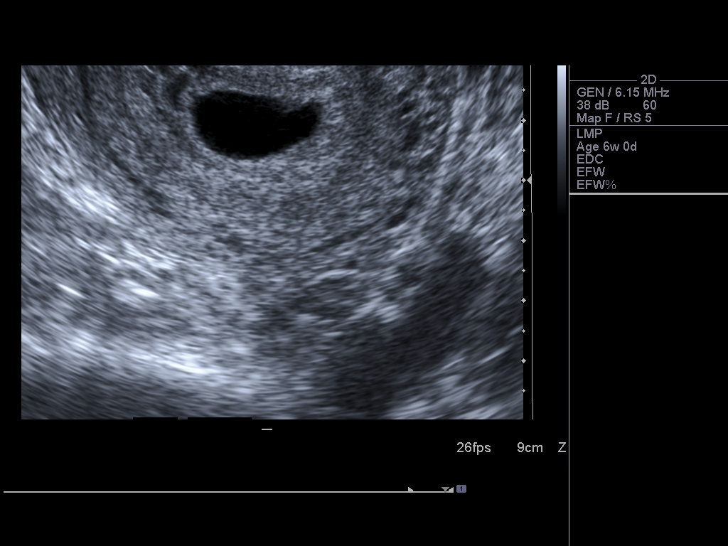
[im 69/69]
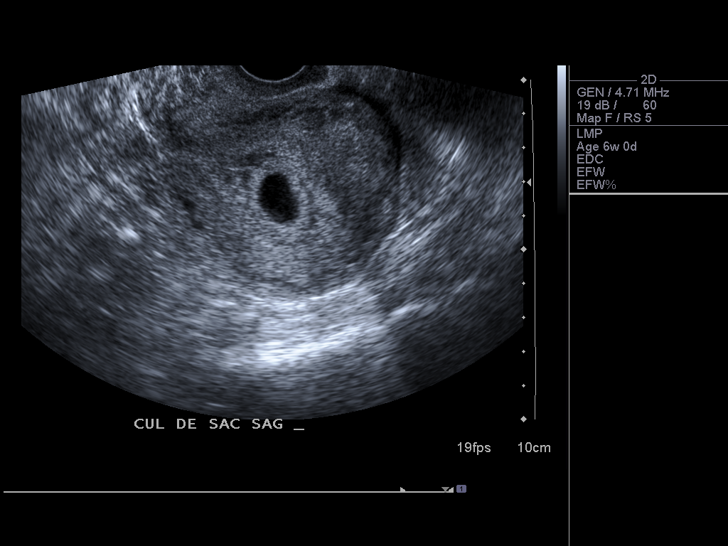

[14 of 28 positions shown; findings below may reference images not displayed]

Intrauterine gestational sac:  Visualized/normal in shape.
Yolk sac: Identified
Embryo: Identified
Cardiac Activity: Identified
Heart Rate: 123 bpm

CRL: 3  mm  6 w  0 d          US EDC: 05/09/2013

Maternal uterus/adnexae:
No subchorionic hemorrhage.  Unable to identify the right ovary.
The left ovary measures 5.2 x 3.9 x 5.2 cm, containing a
physiologic appearing anechoic cyst measuring 4.6 cm.  There is
trace free fluid.
IMPRESSION: Intrauterine gestation with cardiac activity documented.  Estimated
age of 6 weeks 0 days by crown-rump length.

Right ovary not identified.

Normal sonographic appearance to the left ovary, containing a
physiologic appearing 4.6 cm cyst.

Trace free fluid.

## 2014-06-17 ENCOUNTER — Encounter (HOSPITAL_BASED_OUTPATIENT_CLINIC_OR_DEPARTMENT_OTHER): Payer: Self-pay | Admitting: Emergency Medicine
